# Patient Record
Sex: Male | Born: 1970 | Race: White | Hispanic: No | Smoking: Never smoker
Health system: Southern US, Community
[De-identification: ages and names within clinical notes are randomized; demographics above are authoritative.]

## PROBLEM LIST (undated history)

## (undated) DIAGNOSIS — M109 Gout, unspecified: Secondary | ICD-10-CM

## (undated) HISTORY — PX: CYST REMOVAL PEDIATRIC: SHX6282

---

## 1997-10-22 ENCOUNTER — Emergency Department (HOSPITAL_COMMUNITY): Admission: EM | Admit: 1997-10-22 | Discharge: 1997-10-22 | Payer: Self-pay | Admitting: Internal Medicine

## 1997-10-30 ENCOUNTER — Inpatient Hospital Stay (HOSPITAL_COMMUNITY): Admission: EM | Admit: 1997-10-30 | Discharge: 1997-10-31 | Payer: Self-pay | Admitting: Emergency Medicine

## 2000-06-16 ENCOUNTER — Encounter: Payer: Self-pay | Admitting: Emergency Medicine

## 2000-06-16 ENCOUNTER — Emergency Department (HOSPITAL_COMMUNITY): Admission: EM | Admit: 2000-06-16 | Discharge: 2000-06-16 | Payer: Self-pay | Admitting: Emergency Medicine

## 2000-06-23 ENCOUNTER — Ambulatory Visit (HOSPITAL_BASED_OUTPATIENT_CLINIC_OR_DEPARTMENT_OTHER): Admission: RE | Admit: 2000-06-23 | Discharge: 2000-06-23 | Payer: Self-pay | Admitting: Otolaryngology

## 2005-05-18 ENCOUNTER — Encounter: Payer: Self-pay | Admitting: Emergency Medicine

## 2012-04-28 ENCOUNTER — Emergency Department (HOSPITAL_COMMUNITY)
Admission: EM | Admit: 2012-04-28 | Discharge: 2012-04-28 | Disposition: A | Discharge status: Home / Self Care | Payer: Self-pay | Attending: Emergency Medicine | Admitting: Emergency Medicine

## 2012-04-28 ENCOUNTER — Encounter (HOSPITAL_COMMUNITY): Payer: Self-pay | Admitting: *Deleted

## 2012-04-28 DIAGNOSIS — R5381 Other malaise: Secondary | ICD-10-CM | POA: Insufficient documentation

## 2012-04-28 DIAGNOSIS — R51 Headache: Secondary | ICD-10-CM | POA: Insufficient documentation

## 2012-04-28 DIAGNOSIS — R05 Cough: Secondary | ICD-10-CM | POA: Insufficient documentation

## 2012-04-28 DIAGNOSIS — J329 Chronic sinusitis, unspecified: Secondary | ICD-10-CM | POA: Insufficient documentation

## 2012-04-28 DIAGNOSIS — Z862 Personal history of diseases of the blood and blood-forming organs and certain disorders involving the immune mechanism: Secondary | ICD-10-CM | POA: Insufficient documentation

## 2012-04-28 DIAGNOSIS — R6883 Chills (without fever): Secondary | ICD-10-CM | POA: Insufficient documentation

## 2012-04-28 DIAGNOSIS — R059 Cough, unspecified: Secondary | ICD-10-CM | POA: Insufficient documentation

## 2012-04-28 DIAGNOSIS — Z8639 Personal history of other endocrine, nutritional and metabolic disease: Secondary | ICD-10-CM | POA: Insufficient documentation

## 2012-04-28 DIAGNOSIS — Z9889 Other specified postprocedural states: Secondary | ICD-10-CM | POA: Insufficient documentation

## 2012-04-28 HISTORY — DX: Gout, unspecified: M10.9

## 2012-04-28 MED ORDER — AMOXICILLIN-POT CLAVULANATE 875-125 MG PO TABS: 1.0000 | ORAL_TABLET | Freq: Two times a day (BID) | ORAL | Status: DC

## 2012-04-28 MED ORDER — CETIRIZINE-PSEUDOEPHEDRINE ER 5-120 MG PO TB12: 1.0000 | ORAL_TABLET | Freq: Every day | ORAL | Status: DC

## 2012-04-28 MED ORDER — FLUTICASONE PROPIONATE 50 MCG/ACT NA SUSP: 2.0000 | Freq: Every day | NASAL | Status: DC

## 2012-04-28 NOTE — ED Provider Notes (Signed)
History     CSN: [id number]  Arrival date & time 04/28/12  2230   First MD Initiated Contact with Patient 04/28/12 2316      Chief Complaint  Patient presents with  . Nasal Congestion   HPI  History provided by the patient. Patient is a 42 year old male with history of previous left maxillary sinus surgery and cyst removal who presents with complaints of persistent nasal congestion, bilateral sinus pressure and headache. Symptoms have been present for the past 3 weeks. Patient has been using 600 mg Mucinex for several days without any improvement. He also took 5 days of amoxicillin from a friend 2 weeks ago without any improvement. Symptoms are associated with occasional productive cough. Denies any fever or sweats. Occasional chills. Denies any appetite change. No nausea vomiting or diarrhea. Denies any other aggravating or alleviating factors. Denies any other associated symptoms.    Past Medical History  Diagnosis Date  . Gout     Past Surgical History  Procedure Laterality Date  . Cyst removal pediatric      "cyst removal on nasal sinuses"    History reviewed. No pertinent family history.  History  Substance Use Topics  . Smoking status: Never Smoker   . Smokeless tobacco: Not on file  . Alcohol Use: No      Review of Systems  Constitutional: Positive for chills and fatigue. Negative for fever, diaphoresis and appetite change.  HENT: Positive for congestion, rhinorrhea and sinus pressure. Negative for ear pain and sore throat.   Respiratory: Positive for cough.   Gastrointestinal: Negative for nausea, vomiting and diarrhea.  Neurological: Positive for headaches. Negative for light-headedness.  All other systems reviewed and are negative.    Allergies  Penicillins  Home Medications  No current outpatient prescriptions on file.  BP 134/91  Pulse 85  Temp(Src) 98.5 F (36.9 C) (Oral)  Resp 18  SpO2 96%  Physical Exam  Nursing note and vitals  reviewed. Constitutional: He is oriented to person, place, and time. He appears well-developed and well-nourished. No distress.  HENT:  Head: Normocephalic.  Right Ear: Tympanic membrane normal.  Left Ear: Tympanic membrane normal.  Nose: Mucosal edema and rhinorrhea present. Right sinus exhibits maxillary sinus tenderness. Left sinus exhibits maxillary sinus tenderness.  Mouth/Throat: Uvula is midline, oropharynx is clear and moist and mucous membranes are normal.  Drainage in both nostrils with significant edema. There is irritation around the skin of the nostrils consistent with frequent nose blowing.  Eyes: Conjunctivae and EOM are normal. Pupils are equal, round, and reactive to light.  Neck: Normal range of motion. Neck supple.  No meningeal signs  Cardiovascular: Normal rate and regular rhythm.   No murmur heard. Pulmonary/Chest: Effort normal and breath sounds normal. No respiratory distress. He has no wheezes. He has no rales.  Abdominal: Soft.  Musculoskeletal: Normal range of motion.  Lymphadenopathy:    He has no cervical adenopathy.  Neurological: He is alert and oriented to person, place, and time.  Skin: Skin is warm.  Psychiatric: He has a normal mood and affect. His behavior is normal.    ED Course  Procedures      1. Sinusitis       MDM  11:30 PM patient seen and evaluated. Patient appears well in no acute distress. He does not appear severely ill or toxic.  Patient reports his allergy penicillin in his stomach upset. Does admit to recently using amoxicillin for 5 days from a friend. No adverse reaction.  Angus Seller, PA-C 04/28/12 2346

## 2012-04-28 NOTE — ED Notes (Signed)
Pt c/o cold sx and congestion x 3 weeks.  C/o productive cough, HA.  Has taken Mucinex and other OTC medications.

## 2012-04-29 NOTE — ED Provider Notes (Signed)
Medical screening examination/treatment/procedure(s) were performed by non-physician practitioner and as supervising physician I was immediately available for consultation/collaboration.  Odessie Polzin, MD 04/29/12 0518 

## 2013-08-26 ENCOUNTER — Encounter (HOSPITAL_COMMUNITY): Payer: Self-pay | Admitting: Emergency Medicine

## 2013-08-26 ENCOUNTER — Emergency Department (HOSPITAL_COMMUNITY)
Admission: EM | Admit: 2013-08-26 | Discharge: 2013-08-26 | Disposition: A | Discharge status: Home / Self Care | Payer: Self-pay | Attending: Emergency Medicine | Admitting: Emergency Medicine

## 2013-08-26 ENCOUNTER — Emergency Department (HOSPITAL_COMMUNITY): Payer: Self-pay

## 2013-08-26 DIAGNOSIS — S060X9A Concussion with loss of consciousness of unspecified duration, initial encounter: Secondary | ICD-10-CM | POA: Insufficient documentation

## 2013-08-26 DIAGNOSIS — M795 Residual foreign body in soft tissue: Secondary | ICD-10-CM | POA: Insufficient documentation

## 2013-08-26 DIAGNOSIS — T148XXA Other injury of unspecified body region, initial encounter: Secondary | ICD-10-CM

## 2013-08-26 DIAGNOSIS — R4020 Unspecified coma: Secondary | ICD-10-CM

## 2013-08-26 DIAGNOSIS — S161XXA Strain of muscle, fascia and tendon at neck level, initial encounter: Secondary | ICD-10-CM

## 2013-08-26 DIAGNOSIS — Z23 Encounter for immunization: Secondary | ICD-10-CM | POA: Insufficient documentation

## 2013-08-26 DIAGNOSIS — Z8639 Personal history of other endocrine, nutritional and metabolic disease: Secondary | ICD-10-CM | POA: Insufficient documentation

## 2013-08-26 DIAGNOSIS — Z862 Personal history of diseases of the blood and blood-forming organs and certain disorders involving the immune mechanism: Secondary | ICD-10-CM | POA: Insufficient documentation

## 2013-08-26 DIAGNOSIS — Y9389 Activity, other specified: Secondary | ICD-10-CM | POA: Insufficient documentation

## 2013-08-26 DIAGNOSIS — S139XXA Sprain of joints and ligaments of unspecified parts of neck, initial encounter: Secondary | ICD-10-CM | POA: Insufficient documentation

## 2013-08-26 DIAGNOSIS — IMO0002 Reserved for concepts with insufficient information to code with codable children: Secondary | ICD-10-CM | POA: Insufficient documentation

## 2013-08-26 DIAGNOSIS — Z880 Allergy status to penicillin: Secondary | ICD-10-CM | POA: Insufficient documentation

## 2013-08-26 DIAGNOSIS — Y92410 Unspecified street and highway as the place of occurrence of the external cause: Secondary | ICD-10-CM | POA: Insufficient documentation

## 2013-08-26 MED ORDER — TETANUS-DIPHTH-ACELL PERTUSSIS 5-2.5-18.5 LF-MCG/0.5 IM SUSP
0.5000 mL | Freq: Once | INTRAMUSCULAR | Status: AC
  Administered 2013-08-26: 0.5 mL via INTRAMUSCULAR
  Filled 2013-08-26: qty 0.5

## 2013-08-26 MED ORDER — TRAMADOL HCL 50 MG PO TABS: 50.0000 mg | ORAL_TABLET | Freq: Four times a day (QID) | ORAL | Status: DC | PRN

## 2013-08-26 NOTE — Discharge Instructions (Signed)
Abrasion An abrasion is a cut or scrape of the skin. Abrasions do not extend through all layers of the skin and most heal within 10 days. It is important to care for your abrasion properly to prevent infection. CAUSES  Most abrasions are caused by falling on, or gliding across, the ground or other surface. When your skin rubs on something, the outer and inner layer of skin rubs off, causing an abrasion. DIAGNOSIS  Your caregiver will be able to diagnose an abrasion during a physical exam.  TREATMENT  Your treatment depends on how large and deep the abrasion is. Generally, your abrasion will be cleaned with water and a mild soap to remove any dirt or debris. An antibiotic ointment may be put over the abrasion to prevent an infection. A bandage (dressing) may be wrapped around the abrasion to keep it from getting dirty.  You may need a tetanus shot if:  You cannot remember when you had your last tetanus shot.  You have never had a tetanus shot.  The injury broke your skin. If you get a tetanus shot, your arm may swell, get red, and feel warm to the touch. This is common and not a problem. If you need a tetanus shot and you choose not to have one, there is a rare chance of getting tetanus. Sickness from tetanus can be serious.  HOME CARE INSTRUCTIONS   If a dressing was applied, change it at least once a day or as directed by your caregiver. If the bandage sticks, soak it off with warm water.   Wash the area with water and a mild soap to remove all the ointment 2 times a day. Rinse off the soap and pat the area dry with a clean towel.   Reapply any ointment as directed by your caregiver. This will help prevent infection and keep the bandage from sticking. Use gauze over the wound and under the dressing to help keep the bandage from sticking.   Change your dressing right away if it becomes wet or dirty.   Only take over-the-counter or prescription medicines for pain, discomfort, or fever as  directed by your caregiver.   Follow up with your caregiver within 24-48 hours for a wound check, or as directed. If you were not given a wound-check appointment, look closely at your abrasion for redness, swelling, or pus. These are signs of infection. SEEK IMMEDIATE MEDICAL CARE IF:   You have increasing pain in the wound.   You have redness, swelling, or tenderness around the wound.   You have pus coming from the wound.   You have a fever or persistent symptoms for more than 2-3 days.  You have a fever and your symptoms suddenly get worse.  You have a bad smell coming from the wound or dressing.  MAKE SURE YOU:   Understand these instructions.  Will watch your condition.  Will get help right away if you are not doing well or get worse. Document Released: 10/13/2004 Document Revised: 12/21/2011 Document Reviewed: 12/07/2010 [facility] Patient Information 2015 [place], [place]. This information is not intended to replace advice given to you by your health care provider. Make sure you discuss any questions you have with your health care provider. Your x rays are normal the foreign body was removed for the skin wash the area with soap and water and apply a small amount of antibiotic ointment 1-2 times a day for the next 1-2 days
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
## 2013-08-30 NOTE — ED Provider Notes (Signed)
Medical screening examination/treatment/procedure(s) were performed by non-physician practitioner and as supervising physician I was immediately available for consultation/collaboration.   EKG Interpretation None        Matthew Gentry, MD 08/30/13 1441 

## 2015-03-18 ENCOUNTER — Emergency Department (HOSPITAL_COMMUNITY)
Admission: EM | Admit: 2015-03-18 | Discharge: 2015-03-18 | Disposition: A | Discharge status: Home / Self Care | Payer: Self-pay | Attending: Emergency Medicine | Admitting: Emergency Medicine

## 2015-03-18 ENCOUNTER — Encounter (HOSPITAL_COMMUNITY): Payer: Self-pay

## 2015-03-18 DIAGNOSIS — Z880 Allergy status to penicillin: Secondary | ICD-10-CM | POA: Insufficient documentation

## 2015-03-18 DIAGNOSIS — N390 Urinary tract infection, site not specified: Secondary | ICD-10-CM

## 2015-03-18 DIAGNOSIS — Z8739 Personal history of other diseases of the musculoskeletal system and connective tissue: Secondary | ICD-10-CM | POA: Insufficient documentation

## 2015-03-18 DIAGNOSIS — R339 Retention of urine, unspecified: Secondary | ICD-10-CM

## 2015-03-18 LAB — BASIC METABOLIC PANEL
ANION GAP: 9 (ref 5–15)
BUN: 11 mg/dL (ref 6–20)
CO2: 24 mmol/L (ref 22–32)
Calcium: 8.9 mg/dL (ref 8.9–10.3)
Chloride: 105 mmol/L (ref 101–111)
Creatinine, Ser: 1.39 mg/dL — ABNORMAL HIGH (ref 0.61–1.24)
GFR calc Af Amer: >60 mL/min (ref >60)
GFR, EST NON AFRICAN AMERICAN: 59 mL/min — AB (ref >60)
Glucose, Bld: 122 mg/dL — ABNORMAL HIGH (ref 65–99)
Potassium: 3.6 mmol/L (ref 3.5–5.1)
SODIUM: 138 mmol/L (ref 135–145)

## 2015-03-18 LAB — URINE MICROSCOPIC-ADD ON

## 2015-03-18 LAB — URINALYSIS, ROUTINE W REFLEX MICROSCOPIC
Bilirubin Urine: NEGATIVE
GLUCOSE, UA: NEGATIVE mg/dL
Hgb urine dipstick: NEGATIVE
Ketones, ur: NEGATIVE mg/dL
Nitrite: NEGATIVE
PROTEIN: NEGATIVE mg/dL
Specific Gravity, Urine: 1.017 (ref 1.005–1.030)
pH: 5.5 (ref 5.0–8.0)

## 2015-03-18 LAB — CBC
HEMATOCRIT: 47.8 % (ref 39.0–52.0)
Hemoglobin: 16 g/dL (ref 13.0–17.0)
MCH: 30.1 pg (ref 26.0–34.0)
MCHC: 33.5 g/dL (ref 30.0–36.0)
MCV: 89.8 fL (ref 78.0–100.0)
Platelets: 183 10*3/uL (ref 150–400)
RBC: 5.32 MIL/uL (ref 4.22–5.81)
RDW: 13.4 % (ref 11.5–15.5)
WBC: 6.9 10*3/uL (ref 4.0–10.5)

## 2015-03-18 LAB — I-STAT CG4 LACTIC ACID, ED: Lactic Acid, Venous: 1.15 mmol/L (ref 0.5–2.0)

## 2015-03-18 MED ORDER — ACETAMINOPHEN 325 MG PO TABS
650.0000 mg | ORAL_TABLET | Freq: Once | ORAL | Status: AC | PRN
  Administered 2015-03-18: 650 mg via ORAL
  Filled 2015-03-18: qty 2

## 2015-03-18 MED ORDER — TAMSULOSIN HCL 0.4 MG PO CAPS: 0.4000 mg | ORAL_CAPSULE | Freq: Every day | ORAL | Status: DC

## 2015-03-18 MED ORDER — CEPHALEXIN 500 MG PO CAPS: 500.0000 mg | ORAL_CAPSULE | Freq: Three times a day (TID) | ORAL | Status: DC

## 2015-03-18 NOTE — Discharge Instructions (Signed)
Acute Urinary Retention, Male Acute urinary retention is the temporary inability to urinate. This is a common problem in older men. As men age their prostates become larger and block the flow of urine from the bladder. This is usually a problem that has come on gradually.  HOME CARE INSTRUCTIONS If you are sent home with a Foley catheter and a drainage system, you will need to discuss the best course of action with your health care provider. While the catheter is in, maintain a good intake of fluids. Keep the drainage bag emptied and lower than your catheter. This is so that contaminated urine will not flow back into your bladder, which could lead to a urinary tract infection. There are two main types of drainage bags. One is a large bag that usually is used at night. It has a good capacity that will allow you to sleep through the night without having to empty it. The second type is called a leg bag. It has a smaller capacity, so it needs to be emptied more frequently. However, the main advantage is that it can be attached by a leg strap and can go underneath your clothing, allowing you the freedom to move about or leave your home. Only take over-the-counter or prescription medicines for pain, discomfort, or fever as directed by your health care provider.  SEEK MEDICAL CARE IF:  You develop a low-grade fever.  You experience spasms or leakage of urine with the spasms. SEEK IMMEDIATE MEDICAL CARE IF:   You develop chills or fever.  Your catheter stops draining urine.  Your catheter falls out.  You start to develop increased bleeding that does not respond to rest and increased fluid intake. MAKE SURE YOU:  Understand these instructions.  Will watch your condition.  Will get help right away if you are not doing well or get worse.   This information is not intended to replace advice given to you by your health care provider. Make sure you discuss any questions you have with your health care  provider.   Document Released: 04/11/2000 Document Revised: 05/20/2014 Document Reviewed: 06/14/2012 Elsevier Interactive Patient Education 2016 [employer]  Antibiotic Medicine Antibiotic medicines are used to treat infections caused by bacteria. They work by hurting or killing the germs that are making you sick. HOW WILL MY MEDICINE BE PICKED? There are many kinds of antibiotic medicines. To help your doctor pick one, tell your doctor if:  You have any allergies.  You are pregnant or plan to get pregnant.  You are breastfeeding.  You are taking any medicines. These include over-the-counter medicines, prescription medicines, and herbal remedies.  You have a medical condition or problem. If you have questions about why your medicine was picked, ask. FOR HOW LONG SHOULD I TAKE MY MEDICINE? Take your medicine for as long as your doctor tells you to. Do not stop taking it when you feel better. If you stop taking it too soon:  You may start to feel sick again.  Your infection may get harder to treat.  New problems may develop. WHAT IF I MISS A DOSE? Try not to miss any doses of antibiotic medicine. If you miss a dose:  Take the dose as soon as you can.  If you are taking 2 doses a day, take the next dose in 5 to 6 hours.  If you are taking 3 or more doses a day, take the next dose in 2 to 4 hours. Then go back to the normal schedule.  If you cannot take a missed dose, take the next dose on time. Then take the missed dose after you have taken all the doses as told by your doctor, as if you had one more dose left. DOES THIS MEDICINE AFFECT BIRTH CONTROL? Birth control pills may not work while you are on antibiotic medicines. If you are taking birth control pills, keep taking them as usual. Use a second form of birth control, such as a condom. Keep using the second form of birth control until you are finished with your current 1 month cycle of birth control pills. GET HELP  IF:  You get worse.  You do not feel better a few days after starting the medicine.  You throw up (vomit).  There are white patches in your mouth.  You have new joint pain after starting the medicine.  You have new muscle aches after starting the medicine.  You had a fever before starting the medicine, and it comes back.  You have any symptoms of an allergic reaction, such as an itchy rash. If this happens, stop taking the medicine. GET HELP RIGHT AWAY IF:  Your pee (urine) turns dark or becomes blood-colored.  Your skin turns yellow.  You bruise or bleed easily.  You have very bad watery poop (diarrhea) and cramps in your belly (abdomen).  You have a very bad headache.  You have signs of a very bad allergic reaction, such as:  Trouble breathing.  Wheezing.  Swelling of the lips, tongue, or face.  Fainting.  Blisters on the skin or in the mouth. If you have signs of a very bad allergic reaction, stop taking the antibiotic medicine right away.   This information is not intended to replace advice given to you by your health care provider. Make sure you discuss any questions you have with your health care provider.   Document Released: 10/13/2007 Document Revised: 09/24/2014 Document Reviewed: 05/21/2014 Elsevier Interactive Patient Education [employer]

## 2015-03-18 NOTE — ED Notes (Signed)
Pt c/o dysuria and decreased urinary output x "a few months."  Pain score 6/10.  Pt reports that his PCP diagnosed him w/ an enlarged prostate several months ago.  Sts "he gave me an antibiotic, but I don't think it worked."

## 2015-03-18 NOTE — ED Provider Notes (Signed)
CSN: [id number]     Arrival date & time 03/18/15  1534 History   First MD Initiated Contact with Patient 03/18/15 1710     Chief Complaint  Patient presents with  . Dysuria  . Fever     (Consider location/radiation/quality/duration/timing/severity/associated sxs/prior Treatment) Patient is a 45 y.o. male presenting with dysuria. The history is provided by the patient.  Dysuria This is a new problem. The current episode started more than 1 week ago. The problem occurs constantly. The problem has been gradually worsening. Pertinent negatives include no chest pain, no abdominal pain and no shortness of breath. Nothing aggravates the symptoms. Nothing relieves the symptoms. Treatments tried: had unknown antibiotic remotely for similar symptoms. The treatment provided no relief.    Past Medical History  Diagnosis Date  . Gout    Past Surgical History  Procedure Laterality Date  . Cyst removal pediatric      "cyst removal on nasal sinuses"   History reviewed. No pertinent family history. Social History  Substance Use Topics  . Smoking status: Never Smoker   . Smokeless tobacco: None  . Alcohol Use: No    Review of Systems  Respiratory: Negative for shortness of breath.   Cardiovascular: Negative for chest pain.  Gastrointestinal: Negative for abdominal pain.  Genitourinary: Positive for dysuria.  All other systems reviewed and are negative.     Allergies  Ibuprofen and Penicillins  Home Medications   Prior to Admission medications   Medication Sig Start Date End Date Taking? Authorizing Provider  amoxicillin-clavulanate (AUGMENTIN) 875-125 MG per tablet Take 1 tablet by mouth every 12 (twelve) hours. Patient not taking: Reported on 03/18/2015 04/28/12   Ivonne Andrew, PA-C  cetirizine-pseudoephedrine (ZYRTEC-D) 5-120 MG per tablet Take 1 tablet by mouth daily. Patient not taking: Reported on 03/18/2015 04/28/12   Ivonne Andrew, PA-C  fluticasone [facility]) 50 MCG/ACT nasal spray  Place 2 sprays into the nose daily. Patient not taking: Reported on 03/18/2015 04/28/12   Ivonne Andrew, PA-C  traMADol (ULTRAM) 50 MG tablet Take 1 tablet (50 mg total) by mouth every 6 (six) hours as needed. Patient not taking: Reported on 03/18/2015 08/26/13   Earley Favor, NP   BP 128/89 mmHg  Pulse 90  Temp(Src) 100.6 F (38.1 C) (Oral)  Resp 20  SpO2 95% Physical Exam  Constitutional: He is oriented to person, place, and time. He appears well-developed and well-nourished. No distress.  HENT:  Head: Normocephalic and atraumatic.  Eyes: Conjunctivae are normal.  Neck: Neck supple. No tracheal deviation present.  Cardiovascular: Normal rate, regular rhythm and normal heart sounds.   Pulmonary/Chest: Effort normal and breath sounds normal. No respiratory distress.  Abdominal: Soft. He exhibits no distension. There is no tenderness. There is no rebound and no guarding.  Neurological: He is alert and oriented to person, place, and time.  Skin: Skin is warm and dry.  Psychiatric: He has a normal mood and affect.  Vitals reviewed.   ED Course  Procedures (including critical care time)  Emergency Focused Ultrasound Exam Limited ultrasound of the Bladder.   Performed and interpreted by Dr. Clydene Pugh Indication: evaluation for urinary retention Transverse and Sagittal views of the bladder are obtained and calculations are performed to determine an estimated bladder volume for signs of post-renal obstruction.  Findings: Bladder is full with 300+cc Interpretation: evidence of outlet obstruction Images archived electronically.  CPT Codes: 40981 and [phone]   Labs Review Labs Reviewed  URINALYSIS, ROUTINE W REFLEX MICROSCOPIC (NOT AT [facility]) - Abnormal;  Notable for the following:    Leukocytes, UA MODERATE (*)    All other components within normal limits  BASIC METABOLIC PANEL - Abnormal; Notable for the following:    Glucose, Bld 122 (*)    Creatinine, Ser 1.39 (*)    GFR calc non Af Amer  59 (*)    All other components within normal limits  URINE MICROSCOPIC-ADD ON - Abnormal; Notable for the following:    Squamous Epithelial / LPF 0-5 (*)    Bacteria, UA FEW (*)    All other components within normal limits  URINE CULTURE  CBC  I-STAT CG4 LACTIC ACID, ED    Imaging Review No results found. I have personally reviewed and evaluated these images and lab results as part of my medical decision-making.   EKG Interpretation None      MDM   Final diagnoses:  Urinary retention  Urinary tract infection without hematuria, site unspecified   45 y.o. male presents with increased frequency, dysuria, and difficulty passing urine. Has h/o prostatic enlargement. Pt with abnormal PVR. Will treat empirically for clinically evident UTI and pyuria. Foley catheter placed for presumed mild urinary retention as inciting etiology and Pt started on flomax. OP f/u with urology to discontinue foley and reassess. Plan to follow up with PCP as needed and return precautions discussed for worsening or new concerning symptoms.     Lyndal Pulley, MD 03/19/15 [phone]

## 2015-03-19 LAB — URINE CULTURE

## 2015-03-20 ENCOUNTER — Encounter (HOSPITAL_COMMUNITY): Payer: Self-pay | Admitting: *Deleted

## 2015-03-20 ENCOUNTER — Emergency Department (HOSPITAL_COMMUNITY)
Admission: EM | Admit: 2015-03-20 | Discharge: 2015-03-21 | Disposition: A | Discharge status: Home / Self Care | Payer: Self-pay | Attending: Emergency Medicine | Admitting: Emergency Medicine

## 2015-03-20 DIAGNOSIS — Y658 Other specified misadventures during surgical and medical care: Secondary | ICD-10-CM | POA: Insufficient documentation

## 2015-03-20 DIAGNOSIS — T839XXA Unspecified complication of genitourinary prosthetic device, implant and graft, initial encounter: Secondary | ICD-10-CM

## 2015-03-20 DIAGNOSIS — T83098A Other mechanical complication of other indwelling urethral catheter, initial encounter: Secondary | ICD-10-CM | POA: Insufficient documentation

## 2015-03-20 DIAGNOSIS — Z880 Allergy status to penicillin: Secondary | ICD-10-CM | POA: Insufficient documentation

## 2015-03-20 DIAGNOSIS — N4889 Other specified disorders of penis: Secondary | ICD-10-CM | POA: Insufficient documentation

## 2015-03-20 DIAGNOSIS — R5383 Other fatigue: Secondary | ICD-10-CM | POA: Insufficient documentation

## 2015-03-20 DIAGNOSIS — Z792 Long term (current) use of antibiotics: Secondary | ICD-10-CM | POA: Insufficient documentation

## 2015-03-20 DIAGNOSIS — Z79899 Other long term (current) drug therapy: Secondary | ICD-10-CM | POA: Insufficient documentation

## 2015-03-20 DIAGNOSIS — R319 Hematuria, unspecified: Secondary | ICD-10-CM | POA: Insufficient documentation

## 2015-03-20 DIAGNOSIS — Z8739 Personal history of other diseases of the musculoskeletal system and connective tissue: Secondary | ICD-10-CM | POA: Insufficient documentation

## 2015-03-20 LAB — CBC
HCT: 47.3 % (ref 39.0–52.0)
HEMOGLOBIN: 16.6 g/dL (ref 13.0–17.0)
MCH: 31.3 pg (ref 26.0–34.0)
MCHC: 35.1 g/dL (ref 30.0–36.0)
MCV: 89.2 fL (ref 78.0–100.0)
Platelets: 173 10*3/uL (ref 150–400)
RBC: 5.3 MIL/uL (ref 4.22–5.81)
RDW: 13.2 % (ref 11.5–15.5)
WBC: 6.3 10*3/uL (ref 4.0–10.5)

## 2015-03-20 LAB — BASIC METABOLIC PANEL
ANION GAP: 10 (ref 5–15)
BUN: 15 mg/dL (ref 6–20)
CALCIUM: 8.7 mg/dL — AB (ref 8.9–10.3)
CO2: 24 mmol/L (ref 22–32)
Chloride: 105 mmol/L (ref 101–111)
Creatinine, Ser: 1.43 mg/dL — ABNORMAL HIGH (ref 0.61–1.24)
GFR calc non Af Amer: 57 mL/min — ABNORMAL LOW (ref >60)
GLUCOSE: 99 mg/dL (ref 65–99)
POTASSIUM: 3.9 mmol/L (ref 3.5–5.1)
Sodium: 139 mmol/L (ref 135–145)

## 2015-03-20 NOTE — ED Notes (Addendum)
Pt states was here earlier in the week and had urinary catheter placed for urinary retention. Pt says that he has been unable to sleep because it is uncomfortable, c/o urine and blood at cathter insertion site of the penis. Clear/yellow urine noted in catheter leg bag. After triage the pt says he "feels weak" "do not have any strength, like I want to pass out". Pt has had constant nausea for several days. Has been taking meds as prescribed.

## 2015-03-21 NOTE — ED Provider Notes (Signed)
CSN: [id number]     Arrival date & time 03/20/15  2038 History   By signing my name below, I, Arlan Organ, attest that this documentation has been prepared under the direction and in the presence of Devoria Albe, MD at [phone].  Electronically Signed: Arlan Organ, ED Scribe. 03/21/2015. 1:11 AM.   Chief Complaint  Patient presents with  . Weakness   The history is provided by the patient. No language interpreter was used.    HPI Comments: Edward Navarro is a 45 y.o. male with a PMHx of enlarged prostate who presents to the Emergency Department complaining of constant, ongoing weakness and fatigue x 1 week. Pt recently had a urinary catheter placed earlier this week for urinary retention (march 2). He states he was on the toilet and thinks the catheter pulled out a little because afterwards states he noted bleeding and constant pain to the tip of the penis and states the urine was draining around the tube from his penis and not draining into the catheter. Due to discomfort, pt states he has been unable to sleep. No recent fever, chills, nausea, or vomiting. He was seen by Urology yesterday with plans to remove the catheter on 03/25/15. Pt is not a smoker. He denies any alcohol consumption. Patient states he since he has been to the emergency department he is no longer draining around the catheter and his back is filling up with urine. He also states the bleeding has stopped. He states when he saw the blood he thought he was going to pass out.  PCP: Evelena Peat, MD   [facility]  Past Medical History  Diagnosis Date  . Gout    Past Surgical History  Procedure Laterality Date  . Cyst removal pediatric      "cyst removal on nasal sinuses"   No family history on file. Social History  Substance Use Topics  . Smoking status: Never Smoker   . Smokeless tobacco: None  . Alcohol Use: No    Review of Systems  Constitutional: Positive for fatigue. Negative for fever and chills.   Gastrointestinal: Negative for nausea and vomiting.  Genitourinary: Positive for hematuria and penile pain.  Neurological: Positive for weakness.  All other systems reviewed and are negative.     Allergies  Ibuprofen and Penicillins  Home Medications   Prior to Admission medications   Medication Sig Start Date End Date Taking? Authorizing Provider  cephALEXin (KEFLEX) 500 MG capsule Take 1 capsule (500 mg total) by mouth 3 (three) times daily. 03/18/15  Yes Lyndal Pulley, MD  tamsulosin (FLOMAX) 0.4 MG CAPS capsule Take 1 capsule (0.4 mg total) by mouth daily. 03/18/15  Yes Lyndal Pulley, MD  cetirizine-pseudoephedrine (ZYRTEC-D) 5-120 MG per tablet Take 1 tablet by mouth daily. Patient not taking: Reported on 03/18/2015 04/28/12   Ivonne Andrew, PA-C  fluticasone [facility]) 50 MCG/ACT nasal spray Place 2 sprays into the nose daily. Patient not taking: Reported on 03/18/2015 04/28/12   Ivonne Andrew, PA-C  traMADol (ULTRAM) 50 MG tablet Take 1 tablet (50 mg total) by mouth every 6 (six) hours as needed. Patient not taking: Reported on 03/18/2015 08/26/13   Earley Favor, NP   Triage Vitals: BP 121/80 mmHg  Pulse 82  Temp(Src) 99.8 F (37.7 C) (Oral)  Resp 20  Ht 6' (1.829 m)  Wt 180 lb (81.647 kg)  BMI 24.41 kg/m2  SpO2 96%  Vital signs normal     Physical Exam  Constitutional: He is oriented to person,  place, and time. He appears well-developed and well-nourished.  HENT:  Head: Normocephalic and atraumatic.  Eyes: Conjunctivae are normal.  Neck: Normal range of motion.  Pulmonary/Chest: Effort normal. No respiratory distress. He exhibits no crepitus.  Abdominal: Soft. Normal appearance. There is no tenderness.  Genitourinary:  Small amount of dried blood at opening of urethra  Slightly blood tingled urine in leg bag that is full of urine  Musculoskeletal:  Moves all extremities well.   Neurological: He is alert and oriented to person, place, and time.  Skin: Skin is warm, dry and  intact.  Psychiatric: He has a normal mood and affect. His speech is normal and behavior is normal. His mood appears not anxious.  Nursing note and vitals reviewed.   ED Course  Procedures (including critical care time)  DIAGNOSTIC STUDIES: Oxygen Saturation is 96% on RA, adequate by my interpretation.    COORDINATION OF CARE: 12:59 AM- Will order BMP and CBC. Discussed treatment plan with pt at bedside and pt agreed to plan.    Nurses report the foley is in good position. Pt remains painfree and catheter draining well and he was discharged.   I checked patient's urine culture from 2 days ago and he had only 3000 colonies.   Labs Review Results for orders placed or performed during the hospital encounter of 03/20/15  Basic metabolic panel  Result Value Ref Range   Sodium 139 135 - 145 mmol/L   Potassium 3.9 3.5 - 5.1 mmol/L   Chloride 105 101 - 111 mmol/L   CO2 24 22 - 32 mmol/L   Glucose, Bld 99 65 - 99 mg/dL   BUN 15 6 - 20 mg/dL   Creatinine, Ser 1.611.43 (H) 0.61 - 1.24 mg/dL   Calcium 8.7 (L) 8.9 - 10.3 mg/dL   GFR calc non Af Amer 57 (L) >60 mL/min   GFR calc Af Amer >60 >60 mL/min   Anion gap 10 5 - 15  CBC  Result Value Ref Range   WBC 6.3 4.0 - 10.5 K/uL   RBC 5.30 4.22 - 5.81 MIL/uL   Hemoglobin 16.6 13.0 - 17.0 g/dL   HCT 09.647.3 04.539.0 - 40.952.0 %   MCV 89.2 78.0 - 100.0 fL   MCH 31.3 26.0 - 34.0 pg   MCHC 35.1 30.0 - 36.0 g/dL   RDW 81.113.2 91.411.5 - 78.215.5 %   Platelets 173 150 - 400 K/uL   Laboratory interpretation all normal      MDM   Final diagnoses:  Foley catheter problem, initial encounter [facility](HCC)    Plan discharge  Devoria AlbeIva Ourania Hamler, MD, FACEP   I personally performed the services described in this documentation, which was scribed in my presence. The recorded information has been reviewed and considered.  Devoria AlbeIva Hodges Treiber, MD, Concha PyoFACEP   Dasean Brow, MD 03/21/15 [phone]

## 2015-03-21 NOTE — Discharge Instructions (Signed)
Recheck if the catheter stops draining again, you get abdominal pain, vomiting, fever or seem worse. Otherwise follow up with [facility] as you already have planned.

## 2015-03-21 NOTE — ED Notes (Signed)
Foley placement was checked and patients foley is flowing without any problems

## 2015-03-25 ENCOUNTER — Emergency Department (HOSPITAL_COMMUNITY)
Admission: EM | Admit: 2015-03-25 | Discharge: 2015-03-25 | Disposition: A | Discharge status: Home / Self Care | Payer: Self-pay | Attending: Emergency Medicine | Admitting: Emergency Medicine

## 2015-03-25 ENCOUNTER — Encounter (HOSPITAL_COMMUNITY): Payer: Self-pay | Admitting: Emergency Medicine

## 2015-03-25 DIAGNOSIS — Y658 Other specified misadventures during surgical and medical care: Secondary | ICD-10-CM | POA: Insufficient documentation

## 2015-03-25 DIAGNOSIS — Z880 Allergy status to penicillin: Secondary | ICD-10-CM | POA: Insufficient documentation

## 2015-03-25 DIAGNOSIS — Z792 Long term (current) use of antibiotics: Secondary | ICD-10-CM | POA: Insufficient documentation

## 2015-03-25 DIAGNOSIS — Z466 Encounter for fitting and adjustment of urinary device: Secondary | ICD-10-CM | POA: Insufficient documentation

## 2015-03-25 DIAGNOSIS — Z79899 Other long term (current) drug therapy: Secondary | ICD-10-CM | POA: Insufficient documentation

## 2015-03-25 DIAGNOSIS — Z8739 Personal history of other diseases of the musculoskeletal system and connective tissue: Secondary | ICD-10-CM | POA: Insufficient documentation

## 2015-03-25 NOTE — ED Provider Notes (Signed)
CSN: [id number]     Arrival date & time 03/25/15  2035 History   First MD Initiated Contact with Patient 03/25/15 2148     Chief Complaint  Patient presents with  . urinary catheter problem       The history is provided by the patient. No language interpreter was used.   Edward Navarro is a 45 y.o. male who presents to the Emergency Department complaining of foley catheter problem. He had a Foley catheter placed 5 days ago for urinary retention. He was supposed to go to the urology office today to have it removed but he was in court all day and unable to make the appointment. He called the office and they told him to deflate the balloon and pull it out himself. He deflated the balloon for the catheter but experienced excruciating pain in his penis and stopped taking out the catheter. On ED arrival the catheter was easily removed and the balloon was deflated. His pain is now resolved.  No fevers, vomiting, abdominal pain.   Past Medical History  Diagnosis Date  . Gout    Past Surgical History  Procedure Laterality Date  . Cyst removal pediatric      "cyst removal on nasal sinuses"   History reviewed. No pertinent family history. Social History  Substance Use Topics  . Smoking status: Never Smoker   . Smokeless tobacco: None  . Alcohol Use: No    Review of Systems  All other systems reviewed and are negative.     Allergies  Ibuprofen and Penicillins  Home Medications   Prior to Admission medications   Medication Sig Start Date End Date Taking? Authorizing Provider  cephALEXin (KEFLEX) 500 MG capsule Take 1 capsule (500 mg total) by mouth 3 (three) times daily. 03/18/15   Lyndal Pulley, MD  cetirizine-pseudoephedrine (ZYRTEC-D) 5-120 MG per tablet Take 1 tablet by mouth daily. Patient not taking: Reported on 03/18/2015 04/28/12   Ivonne Andrew, PA-C  fluticasone [facility]) 50 MCG/ACT nasal spray Place 2 sprays into the nose daily. Patient not taking: Reported on 03/18/2015 04/28/12    Ivonne Andrew, PA-C  tamsulosin (FLOMAX) 0.4 MG CAPS capsule Take 1 capsule (0.4 mg total) by mouth daily. 03/18/15   Lyndal Pulley, MD  traMADol (ULTRAM) 50 MG tablet Take 1 tablet (50 mg total) by mouth every 6 (six) hours as needed. Patient not taking: Reported on 03/18/2015 08/26/13   Earley Favor, NP   BP 112/82 mmHg  Pulse 62  Temp(Src) 98.2 F (36.8 C) (Oral)  Resp 18  Ht 6' (1.829 m)  Wt 180 lb (81.647 kg)  BMI 24.41 kg/m2  SpO2 98% Physical Exam  Constitutional: He is oriented to person, place, and time. He appears well-developed and well-nourished.  HENT:  Head: Normocephalic and atraumatic.  Cardiovascular: Normal rate and regular rhythm.   No murmur heard. Pulmonary/Chest: Effort normal and breath sounds normal. No respiratory distress.  Abdominal: Soft. There is no tenderness. There is no rebound and no guarding.  Genitourinary:  Small amount of blood at the urethral meatus  Musculoskeletal: He exhibits no edema or tenderness.  Neurological: He is alert and oriented to person, place, and time.  Skin: Skin is warm and dry.  Psychiatric: He has a normal mood and affect. His behavior is normal.  Nursing note and vitals reviewed.   ED Course  Procedures (including critical care time) Labs Review Labs Reviewed - No data to display  Imaging Review No results found. I have personally reviewed  and evaluated these images and lab results as part of my medical decision-making.   EKG Interpretation None      MDM   Final diagnoses:  Encounter for Foley catheter removal    Patient here for removal of Foley catheter. While the patient was in triage before catheter was easily removed and the balloon was deflated. He has no residual pain. Plan to DC home with urology follow-up. 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  Tilden FossaElizabeth Mikaela Hilgeman, MD 03/25/15 2351

## 2015-03-25 NOTE — Discharge Instructions (Signed)
Acute Urinary Retention, Male °Acute urinary retention is the temporary inability to urinate. °This is a common problem in older men. As men age their prostates become larger and block the flow of urine from the bladder. This is usually a problem that has come on gradually.  °HOME CARE INSTRUCTIONS °If you are sent home with a Foley catheter and a drainage system, you will need to discuss the best course of action with your health care provider. While the catheter is in, maintain a good intake of fluids. Keep the drainage bag emptied and lower than your catheter. This is so that contaminated urine will not flow back into your bladder, which could lead to a urinary tract infection. °There are two main types of drainage bags. One is a large bag that usually is used at night. It has a good capacity that will allow you to sleep through the night without having to empty it. The second type is called a leg bag. It has a smaller capacity, so it needs to be emptied more frequently. However, the main advantage is that it can be attached by a leg strap and can go underneath your clothing, allowing you the freedom to move about or leave your home. °Only take over-the-counter or prescription medicines for pain, discomfort, or fever as directed by your health care provider.  °SEEK MEDICAL CARE IF: °· You develop a low-grade fever. °· You experience spasms or leakage of urine with the spasms. °SEEK IMMEDIATE MEDICAL CARE IF:  °· You develop chills or fever. °· Your catheter stops draining urine. °· Your catheter falls out. °· You start to develop increased bleeding that does not respond to rest and increased fluid intake. °MAKE SURE YOU: °· Understand these instructions. °· Will watch your condition. °· Will get help right away if you are not doing well or get worse. °  °This information is not intended to replace advice given to you by your health care provider. Make sure you discuss any questions you have with your health care  provider. °  °Document Released: 04/11/2000 Document Revised: 05/20/2014 Document Reviewed: 06/14/2012 °Elsevier Interactive Patient Education ©2016 [employer] ° °

## 2015-03-25 NOTE — ED Notes (Signed)
Pt states he had a catheter placed the other day and his urologist told him to remove it today  Pt was given instruction on how to deflate balloon and remove cath  Pt states he removed the water and tried to remove catheter but it got to a certain point and got stuck and became very painful  Pt states he got dizzy like he was going to pass out so he came here   Checked catheter placement in triage  Balloon was completely deflated  Catheter removed without difficulty

## 2015-07-03 ENCOUNTER — Emergency Department (HOSPITAL_COMMUNITY): Payer: Self-pay

## 2015-07-03 ENCOUNTER — Encounter (HOSPITAL_COMMUNITY): Payer: Self-pay | Admitting: Emergency Medicine

## 2015-07-03 ENCOUNTER — Emergency Department (HOSPITAL_COMMUNITY)
Admission: EM | Admit: 2015-07-03 | Discharge: 2015-07-03 | Disposition: A | Discharge status: Home / Self Care | Payer: Self-pay | Attending: Emergency Medicine | Admitting: Emergency Medicine

## 2015-07-03 DIAGNOSIS — R51 Headache: Secondary | ICD-10-CM | POA: Insufficient documentation

## 2015-07-03 DIAGNOSIS — Z791 Long term (current) use of non-steroidal anti-inflammatories (NSAID): Secondary | ICD-10-CM | POA: Insufficient documentation

## 2015-07-03 DIAGNOSIS — R0981 Nasal congestion: Secondary | ICD-10-CM | POA: Insufficient documentation

## 2015-07-03 DIAGNOSIS — J3489 Other specified disorders of nose and nasal sinuses: Secondary | ICD-10-CM | POA: Insufficient documentation

## 2015-07-03 DIAGNOSIS — R05 Cough: Secondary | ICD-10-CM | POA: Insufficient documentation

## 2015-07-03 DIAGNOSIS — Z79899 Other long term (current) drug therapy: Secondary | ICD-10-CM | POA: Insufficient documentation

## 2015-07-03 DIAGNOSIS — H6091 Unspecified otitis externa, right ear: Secondary | ICD-10-CM

## 2015-07-03 LAB — CBC WITH DIFFERENTIAL/PLATELET
BASOS PCT: 0 %
Basophils Absolute: 0 10*3/uL (ref 0.0–0.1)
Eosinophils Absolute: 0.2 10*3/uL (ref 0.0–0.7)
Eosinophils Relative: 2 %
HEMATOCRIT: 45.9 % (ref 39.0–52.0)
HEMOGLOBIN: 15.7 g/dL (ref 13.0–17.0)
LYMPHS ABS: 1.6 10*3/uL (ref 0.7–4.0)
LYMPHS PCT: 15 %
MCH: 30.1 pg (ref 26.0–34.0)
MCHC: 34.2 g/dL (ref 30.0–36.0)
MCV: 87.9 fL (ref 78.0–100.0)
MONO ABS: 0.8 10*3/uL (ref 0.1–1.0)
MONOS PCT: 8 %
NEUTROS ABS: 7.7 10*3/uL (ref 1.7–7.7)
NEUTROS PCT: 75 %
Platelets: 211 10*3/uL (ref 150–400)
RBC: 5.22 MIL/uL (ref 4.22–5.81)
RDW: 13.5 % (ref 11.5–15.5)
WBC: 10.4 10*3/uL (ref 4.0–10.5)

## 2015-07-03 LAB — BASIC METABOLIC PANEL
Anion gap: 6 (ref 5–15)
BUN: 6 mg/dL (ref 6–20)
CALCIUM: 8.9 mg/dL (ref 8.9–10.3)
CHLORIDE: 107 mmol/L (ref 101–111)
CO2: 28 mmol/L (ref 22–32)
Creatinine, Ser: 1.25 mg/dL — ABNORMAL HIGH (ref 0.61–1.24)
GFR calc non Af Amer: >60 mL/min (ref >60)
GLUCOSE: 116 mg/dL — AB (ref 65–99)
POTASSIUM: 3.8 mmol/L (ref 3.5–5.1)
Sodium: 141 mmol/L (ref 135–145)

## 2015-07-03 MED ORDER — HYDROCODONE-ACETAMINOPHEN 5-325 MG PO TABS
1.0000 | ORAL_TABLET | Freq: Once | ORAL | Status: AC
  Administered 2015-07-03: 1 via ORAL
  Filled 2015-07-03: qty 1

## 2015-07-03 MED ORDER — CIPROFLOXACIN HCL 500 MG PO TABS: 500.0000 mg | ORAL_TABLET | Freq: Two times a day (BID) | ORAL | Status: DC

## 2015-07-03 MED ORDER — CIPROFLOXACIN-DEXAMETHASONE 0.3-0.1 % OT SUSP: 4.0000 [drp] | Freq: Two times a day (BID) | OTIC | Status: DC

## 2015-07-03 MED ORDER — FENTANYL CITRATE (PF) 100 MCG/2ML IJ SOLN
50.0000 ug | Freq: Once | INTRAMUSCULAR | Status: AC
  Administered 2015-07-03: 50 ug via INTRAVENOUS
  Filled 2015-07-03: qty 2

## 2015-07-03 MED ORDER — TRAMADOL HCL 50 MG PO TABS: 50.0000 mg | ORAL_TABLET | Freq: Four times a day (QID) | ORAL | Status: DC | PRN

## 2015-07-03 NOTE — ED Provider Notes (Signed)
Medical screening examination/treatment/procedure(s) were conducted as a shared visit with non-physician practitioner(s) and myself.  I personally evaluated the patient during the encounter.   EKG Interpretation None      Results for orders placed or performed during the hospital encounter of 07/03/15  Basic metabolic panel  Result Value Ref Range   Sodium 141 135 - 145 mmol/L   Potassium 3.8 3.5 - 5.1 mmol/L   Chloride 107 101 - 111 mmol/L   CO2 28 22 - 32 mmol/L   Glucose, Bld 116 (H) 65 - 99 mg/dL   BUN 6 6 - 20 mg/dL   Creatinine, Ser 1.61 (H) 0.61 - 1.24 mg/dL   Calcium 8.9 8.9 - 09.6 mg/dL   GFR calc non Af Amer >60 >60 mL/min   GFR calc Af Amer >60 >60 mL/min   Anion gap 6 5 - 15  CBC with Differential  Result Value Ref Range   WBC 10.4 4.0 - 10.5 K/uL   RBC 5.22 4.22 - 5.81 MIL/uL   Hemoglobin 15.7 13.0 - 17.0 g/dL   HCT 04.5 40.9 - 81.1 %   MCV 87.9 78.0 - 100.0 fL   MCH 30.1 26.0 - 34.0 pg   MCHC 34.2 30.0 - 36.0 g/dL   RDW 91.4 78.2 - 95.6 %   Platelets 211 150 - 400 K/uL   Neutrophils Relative % 75 %   Neutro Abs 7.7 1.7 - 7.7 K/uL   Lymphocytes Relative 15 %   Lymphs Abs 1.6 0.7 - 4.0 K/uL   Monocytes Relative 8 %   Monocytes Absolute 0.8 0.1 - 1.0 K/uL   Eosinophils Relative 2 %   Eosinophils Absolute 0.2 0.0 - 0.7 K/uL   Basophils Relative 0 %   Basophils Absolute 0.0 0.0 - 0.1 K/uL   Ct Head Wo Contrast  07/03/2015  CLINICAL DATA:  Two day history of worsening ear region pain with otic discharge on the right. Right-sided headache and right-sided facial pain EXAM: CT HEAD WITHOUT CONTRAST TECHNIQUE: Contiguous axial images were obtained from the base of the skull through the vertex without intravenous contrast. COMPARISON:  August 26, 2013 FINDINGS: The ventricles are normal in size and configuration. There is no intracranial mass, hemorrhage, extra-axial fluid collection, or midline shift. Gray-white compartments appear normal. No acute infarct is evident.  The bony calvarium appears intact. Mastoids on the left are clear. There is opacification of multiple mastoid air cells on the right. There is no air-fluid level on the right. The middle and inner ear structures appear symmetric and normal bilaterally. No obstruction of the external auditory canals is appreciable. There is opacification of the visualized right maxillary antrum. There is mucosal thickening in the left maxillary antrum. There is opacification of multiple ethmoid air cells bilaterally. 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 No intraorbital lesions are evident. IMPRESSION: There is opacification of multiple mastoid air cells on the right without air-fluid level evident. This mastoid opacity on the right was not present on prior study. Multifocal paranasal sinus disease. 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 No intracranial mass, hemorrhage, or focal gray -white compartment lesions/acute appearing infarct. Electronically Signed   By: Bretta Bang III M.D.   On: 07/03/2015 13:38    Patient seen by me. Patient with complaint of right ear pain since Wednesday. Patient has a serous type fluid draining from the right ear. Also has tenderness to palpation to the mastoid area on that side.  Examination of  the right TM raises some suggestion of maybe some bullae at the lower part of TM not able to appreciate perforation but there should be one there.   CT of the head shows chronic or sinus disease also shows evidence of opacification in the right mastoid air cells but no air-fluid level.  We'll have the physician assistant discuss with ear nose and throat with a treatment plan. Would recommend treatment with oral antibiotics as well as Cipro I ear drops. Follow up with ear nose and throat.  Vanetta MuldersScott Pama Roskos, MD 07/03/15 [phone]

## 2015-07-03 NOTE — ED Notes (Signed)
Pt complaint of right ear pain onset Wednesday unrelieved by Advil.

## 2015-07-03 NOTE — ED Provider Notes (Signed)
CSN: [id number]     Arrival date & time 07/03/15  1052 History  By signing my name below, I, Evon Slack, attest that this documentation has been prepared under the direction and in the presence of [employer], PA-C. Electronically Signed: Evon Slack, ED Scribe. 07/03/2015. 12:41 PM.     Chief Complaint  Patient presents with  . Otalgia    The history is provided by the patient. No language interpreter was used.   HPI Comments: Edward Navarro is a 45 y.o. male who presents to the Emergency Department complaining of worsening right ear pain onset 2 days prior. Pt states that he has has associated ear discharge as well. Pt states she has had associated nasal congestion, cough, sore throat, HA and right sided facial pain. Pt states that he  Pt states that he has tried Advil and mucinex with no relief. Denies fever, chills or vision changes.       Past Medical History  Diagnosis Date  . Gout    Past Surgical History  Procedure Laterality Date  . Cyst removal pediatric      "cyst removal on nasal sinuses"   No family history on file. Social History  Substance Use Topics  . Smoking status: Never Smoker   . Smokeless tobacco: None  . Alcohol Use: No    Review of Systems  Constitutional: Negative for fever and chills.  HENT: Positive for congestion, ear discharge, ear pain and rhinorrhea.   Eyes: Negative for visual disturbance.  Respiratory: Positive for cough.   Neurological: Positive for headaches.   A complete 10 system review of systems was obtained and all systems are negative except as noted in the HPI and PMH.     Allergies  Ibuprofen and Penicillins  Home Medications   Prior to Admission medications   Medication Sig Start Date End Date Taking? Authorizing Provider  colchicine 0.6 MG tablet Take 0.6 mg by mouth daily.   Yes Historical Provider, MD  guaiFENesin (MUCINEX) 600 MG 12 hr tablet Take 600 mg by mouth daily as needed for cough.   Yes  Historical Provider, MD  ibuprofen (ADVIL,MOTRIN) 200 MG tablet Take 200 mg by mouth every 6 (six) hours as needed for moderate pain.   Yes Historical Provider, MD  cephALEXin (KEFLEX) 500 MG capsule Take 1 capsule (500 mg total) by mouth 3 (three) times daily. Patient not taking: Reported on 07/03/2015 03/18/15   Lyndal Pulley, MD  cetirizine-pseudoephedrine (ZYRTEC-D) 5-120 MG per tablet Take 1 tablet by mouth daily. Patient not taking: Reported on 03/18/2015 04/28/12   Ivonne Andrew, PA-C  fluticasone [facility]) 50 MCG/ACT nasal spray Place 2 sprays into the nose daily. Patient not taking: Reported on 03/18/2015 04/28/12   Ivonne Andrew, PA-C  tamsulosin (FLOMAX) 0.4 MG CAPS capsule Take 1 capsule (0.4 mg total) by mouth daily. Patient not taking: Reported on 07/03/2015 03/18/15   Lyndal Pulley, MD  traMADol (ULTRAM) 50 MG tablet Take 1 tablet (50 mg total) by mouth every 6 (six) hours as needed. Patient not taking: Reported on 03/18/2015 08/26/13   Earley Favor, NP   BP 145/95 mmHg  Pulse 61  Temp(Src) 98.2 F (36.8 C) (Oral)  Resp 15  SpO2 99%   Physical Exam  Constitutional: He is oriented to person, place, and time. He appears well-developed and well-nourished. No distress.  HENT:  Head: Normocephalic and atraumatic.  Right Ear: There is tenderness.  Clear yellowish discharge coming out of right external ear, tenderness along right externa  canal. Diffuse tenderness in right posterior ear region and diffuse tenderness in right temporal area with some mild erythema.  No tenderness to tragus or auricle. However auricle is slightly protruding.  Eyes: Conjunctivae and EOM are normal.  Neck: Neck supple. No tracheal deviation present.  Cardiovascular: Normal rate.   Pulmonary/Chest: Effort normal. No respiratory distress.  Musculoskeletal: Normal range of motion.  Neurological: He is alert and oriented to person, place, and time.  Cranial nerves II through XII grossly intact.  Skin: Skin is warm and  dry.  Psychiatric: He has a normal mood and affect. His behavior is normal.  Nursing note and vitals reviewed.   ED Course  Procedures (including critical care time) DIAGNOSTIC STUDIES: Oxygen Saturation is 99% on RA, normal by my interpretation.    COORDINATION OF CARE: 12:41 PM-Discussed treatment plan with pt at bedside and pt agreed to plan.    Labs Review Labs Reviewed  CBC WITH DIFFERENTIAL/PLATELET  BASIC METABOLIC PANEL    Imaging Review Ct Head Wo Contrast  07/03/2015  CLINICAL DATA:  Two day history of worsening ear region pain with otic discharge on the right. Right-sided headache and right-sided facial pain EXAM: CT HEAD WITHOUT CONTRAST TECHNIQUE: Contiguous axial images were obtained from the base of the skull through the vertex without intravenous contrast. COMPARISON:  August 26, 2013 FINDINGS: The ventricles are normal in size and configuration. There is no intracranial mass, hemorrhage, extra-axial fluid collection, or midline shift. Gray-white compartments appear normal. No acute infarct is evident. The bony calvarium appears intact. Mastoids on the left are clear. There is opacification of multiple mastoid air cells on the right. There is no air-fluid level on the right. The middle and inner ear structures appear symmetric and normal bilaterally. No obstruction of the external auditory canals is appreciable. There is opacification of the visualized right maxillary antrum. There is mucosal thickening in the left maxillary antrum. There is opacification of multiple ethmoid air cells bilaterally. 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 No intraorbital lesions are evident. IMPRESSION: There is opacification of multiple mastoid air cells on the right without air-fluid level evident. This mastoid opacity on the right was not present on prior study. Multifocal paranasal sinus disease. 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 No  intracranial mass, hemorrhage, or focal gray -white compartment lesions/acute appearing infarct. Electronically Signed   By: Bretta BangWilliam  Woodruff III M.D.   On: 07/03/2015 13:38      EKG Interpretation None      MDM   Patient presents with otalgia and exam Concerning for possible mastoiditis. Discussed with Dr. Deretha EmoryZackowski. We will obtain basic labs, CT for further evaluation of etiology of pain. He is afebrile and hemodynamically stable. Pain managed in emergency department. Not consistent with HSV/Ramsay Hunt. Screening labs are unremarkable. CT scan shows opacifications in right mastoid air cells but no air-fluid levels. Discussed with ENT, Dr.Wolicki. Presentation is likely more consistent with an external otitis. Will treat with oral ciprofloxacin as well as Ciprodex and will follow-up with patient in clinic 2 weeks.  Final diagnoses:  Otitis externa, right      I personally performed the services described in this documentation, which was scribed in my presence. The recorded information has been reviewed and is accurate.      Joycie PeekBenjamin Charnay Nazario, PA-C 07/03/15 1539

## 2015-07-03 NOTE — Discharge Instructions (Signed)
Your symptoms are likely due to an external ear infection. You'll be treated for this with oral antibiotics as well as antibiotic drops. Please take all of your medications as prescribed. Follow-up with ENT, Dr. Lazarus SalinesWolicki, in 2 weeks for reevaluation. Return to ED for new or worsening symptoms as we discussed.  Otitis Externa Otitis externa is a bacterial or fungal infection of the outer ear canal. This is the area from the eardrum to the outside of the ear. Otitis externa is sometimes called "swimmer's ear." CAUSES  Possible causes of infection include:  Swimming in dirty water.  Moisture remaining in the ear after swimming or bathing.  Mild injury (trauma) to the ear.  Objects stuck in the ear (foreign body).  Cuts or scrapes (abrasions) on the outside of the ear. SIGNS AND SYMPTOMS  The first symptom of infection is often itching in the ear canal. Later signs and symptoms may include swelling and redness of the ear canal, ear pain, and yellowish-white fluid (pus) coming from the ear. The ear pain may be worse when pulling on the earlobe. DIAGNOSIS  Your health care provider will perform a physical exam. A sample of fluid may be taken from the ear and examined for bacteria or fungi. TREATMENT  Antibiotic ear drops are often given for 10 to 14 days. Treatment may also include pain medicine or corticosteroids to reduce itching and swelling. HOME CARE INSTRUCTIONS   Apply antibiotic ear drops to the ear canal as prescribed by your health care provider.  Take medicines only as directed by your health care provider.  If you have diabetes, follow any additional treatment instructions from your health care provider.  Keep all follow-up visits as directed by your health care provider. PREVENTION   Keep your ear dry. Use the corner of a towel to absorb water out of the ear canal after swimming or bathing.  Avoid scratching or putting objects inside your ear. This can damage the ear canal or  remove the protective wax that lines the canal. This makes it easier for bacteria and fungi to grow.  Avoid swimming in lakes, polluted water, or poorly chlorinated pools.  You may use ear drops made of rubbing alcohol and vinegar after swimming. Combine equal parts of white vinegar and alcohol in a bottle. Put 3 or 4 drops into each ear after swimming. SEEK MEDICAL CARE IF:   You have a fever.  Your ear is still red, swollen, painful, or draining pus after 3 days.  Your redness, swelling, or pain gets worse.  You have a severe headache.  You have redness, swelling, pain, or tenderness in the area behind your ear. MAKE SURE YOU:   Understand these instructions.  Will watch your condition.  Will get help right away if you are not doing well or get worse.   This information is not intended to replace advice given to you by your health care provider. Make sure you discuss any questions you have with your health care provider.   Document Released: 01/03/2005 Document Revised: 01/24/2014 Document Reviewed: 01/20/2011 Elsevier Interactive Patient Education [employer] Elsevier Inc.

## 2015-07-14 ENCOUNTER — Emergency Department (HOSPITAL_COMMUNITY)
Admission: EM | Admit: 2015-07-14 | Discharge: 2015-07-14 | Disposition: A | Discharge status: Home / Self Care | Payer: Self-pay | Attending: Emergency Medicine | Admitting: Emergency Medicine

## 2015-07-14 ENCOUNTER — Encounter (HOSPITAL_COMMUNITY): Payer: Self-pay | Admitting: Emergency Medicine

## 2015-07-14 DIAGNOSIS — Z79899 Other long term (current) drug therapy: Secondary | ICD-10-CM | POA: Insufficient documentation

## 2015-07-14 DIAGNOSIS — H6691 Otitis media, unspecified, right ear: Secondary | ICD-10-CM | POA: Insufficient documentation

## 2015-07-14 LAB — CBC WITH DIFFERENTIAL/PLATELET
BASOS ABS: 0 10*3/uL (ref 0.0–0.1)
BASOS PCT: 0 %
EOS ABS: 0.3 10*3/uL (ref 0.0–0.7)
Eosinophils Relative: 2 %
HCT: 46.8 % (ref 39.0–52.0)
Hemoglobin: 16.6 g/dL (ref 13.0–17.0)
Lymphocytes Relative: 19 %
Lymphs Abs: 2.3 10*3/uL (ref 0.7–4.0)
MCH: 30.9 pg (ref 26.0–34.0)
MCHC: 35.5 g/dL (ref 30.0–36.0)
MCV: 87 fL (ref 78.0–100.0)
MONO ABS: 0.6 10*3/uL (ref 0.1–1.0)
MONOS PCT: 5 %
NEUTROS ABS: 8.8 10*3/uL — AB (ref 1.7–7.7)
Neutrophils Relative %: 74 %
PLATELETS: 253 10*3/uL (ref 150–400)
RBC: 5.38 MIL/uL (ref 4.22–5.81)
RDW: 13.5 % (ref 11.5–15.5)
WBC: 12 10*3/uL — ABNORMAL HIGH (ref 4.0–10.5)

## 2015-07-14 LAB — I-STAT CHEM 8, ED
BUN: 8 mg/dL (ref 6–20)
CREATININE: 1.2 mg/dL (ref 0.61–1.24)
Calcium, Ion: 1.15 mmol/L (ref 1.13–1.30)
Chloride: 104 mmol/L (ref 101–111)
Glucose, Bld: 88 mg/dL (ref 65–99)
HEMATOCRIT: 50 % (ref 39.0–52.0)
HEMOGLOBIN: 17 g/dL (ref 13.0–17.0)
POTASSIUM: 3.8 mmol/L (ref 3.5–5.1)
SODIUM: 142 mmol/L (ref 135–145)
TCO2: 26 mmol/L (ref 0–100)

## 2015-07-14 LAB — I-STAT CG4 LACTIC ACID, ED: LACTIC ACID, VENOUS: 0.87 mmol/L (ref 0.5–1.9)

## 2015-07-14 MED ORDER — CIPROFLOXACIN-DEXAMETHASONE 0.3-0.1 % OT SUSP: 4.0000 [drp] | Freq: Once | OTIC | Status: DC

## 2015-07-14 MED ORDER — MORPHINE SULFATE (PF) 4 MG/ML IV SOLN
4.0000 mg | Freq: Once | INTRAVENOUS | Status: AC
  Administered 2015-07-14: 4 mg via INTRAVENOUS
  Filled 2015-07-14: qty 1

## 2015-07-14 MED ORDER — ONDANSETRON HCL 4 MG/2ML IJ SOLN
4.0000 mg | Freq: Once | INTRAMUSCULAR | Status: AC
  Administered 2015-07-14: 4 mg via INTRAVENOUS
  Filled 2015-07-14: qty 2

## 2015-07-14 MED ORDER — TRAMADOL HCL 50 MG PO TABS: 50.0000 mg | ORAL_TABLET | Freq: Four times a day (QID) | ORAL | Status: DC | PRN

## 2015-07-14 MED ORDER — OXYCODONE-ACETAMINOPHEN 5-325 MG PO TABS: 1.0000 | ORAL_TABLET | Freq: Once | ORAL | Status: DC

## 2015-07-14 MED ORDER — CIPROFLOXACIN-DEXAMETHASONE 0.3-0.1 % OT SUSP
4.0000 [drp] | Freq: Once | OTIC | Status: AC
  Administered 2015-07-14: 4 [drp] via OTIC
  Filled 2015-07-14 (×2): qty 7.5

## 2015-07-14 NOTE — Progress Notes (Signed)
EDCM spoke to patient at bedside. Patient [place].  [facility], informed patient of services there and walk in times.  EDCM also provided patient with list of pcps who accept self pay patients, list of discount pharmacies and websites needymeds.org and GoodRX.com for medication assistance, phone number to inquire about the orange card, phone number to inquire about Mediciad, phone number to inquire about the Affordable Care Act, financial resources in the community such as local churches, [employer], urban ministries, and dental assistance for uninsured patients.  Patient thankful for resources.  No further EDCM needs at this time.  Patient reports [facility] [place].  Epic updated.

## 2015-07-14 NOTE — Discharge Instructions (Signed)

## 2015-07-14 NOTE — ED Notes (Signed)
Pt states that he was seen and given antibiotics for a R ear infection last week but he was only able to fill the Cipro and not the ear drops so he is still in unbearable pain. States he has been taking tylenol as well. Alert and oriented.

## 2015-07-14 NOTE — ED Provider Notes (Signed)
Patient sign out from Lakelandatyana K. , [place]-C  Patient was seen on 6/16 for right sided ear pain, concern for mastoiditis at that time and had a negative scan. Dr. Lazarus SalinesWolicki was contacted and it was recommended he be started on Cipro PO and Ciprodex. The patient has been taking oral Ciprodex but has not taking his otic drops because he could not afford them.  I spoke with Dr. Annalee GentaShoemaker, he does not recommend repeating CT scan at this time. He says pt needs cipro-dex drops and to be seen in the office tomorrow. He wants him to call the office at 9 am and tell them he was seen in the ED and needs to be seen same day at the ENT office and have his ear suctioned and looked at with special ENT equipment. Does not recommend admission, changing medication, IV medications.  We will give him CiproDex drops and patient has agreed to call office in the morning.  Medications  ciprofloxacin-dexamethasone (CIPRODEX) 0.3-0.1 % otic suspension 4 drop (4 drops Right Ear Given 07/14/15 2128)  morphine 4 MG/ML injection 4 mg (4 mg Intravenous Given 07/14/15 2129)  ondansetron (ZOFRAN) injection 4 mg (4 mg Intravenous Given 07/14/15 2129)  morphine 4 MG/ML injection 4 mg (4 mg Intravenous Given 07/14/15 2248)    I discussed results, diagnoses and plan with Lucky Cowboyaniel L Hornsby. They voice there understanding and questions were answered. We discussed follow-up recommendations and return precautions.   Marlon Peliffany Ishitha Roper, PA-C 07/14/15 [phone]  Gwyneth SproutWhitney Plunkett, MD 07/15/15 [id number]

## 2015-07-14 NOTE — ED Provider Notes (Signed)
CSN: [id number]     Arrival date & time 07/14/15  2049 History  By signing my name below, I, Phillis HaggisGabriella Gaje, attest that this documentation has been prepared under the direction and in the presence of Yvana Samonte, PA-C. Electronically Signed: Phillis HaggisGabriella Gaje, ED Scribe. 07/14/2015. 9:04 PM.   Chief Complaint  Patient presents with  . Otalgia   The history is provided by the patient. No language interpreter was used.  HPI Comments: Edward Navarro is a 45 y.o. male who presents to the Emergency Department complaining of gradually worsening right ear pain onset 11 days ago. Pt was previously seen in the ED for the same on 07/03/15. He states that he was diagnosed with an ear infection and prescribed ear drops and Cipro. Pt was unable to fill the ear drop prescription due to price. He reports that his pain was bearable yesterday but has increasingly gotten worse today. He has been taking the Cipro as directed and Tylenol for pain to no relief. He denies fever, chills, ear drainage, hearing loss, sore throat, nausea, or vomiting. Pt states that he made an appointment with ENT but is not able to be seen until 07/29/15.  Past Medical History  Diagnosis Date  . Gout    Past Surgical History  Procedure Laterality Date  . Cyst removal pediatric      "cyst removal on nasal sinuses"   History reviewed. No pertinent family history. Social History  Substance Use Topics  . Smoking status: Never Smoker   . Smokeless tobacco: None  . Alcohol Use: No    Review of Systems  Constitutional: Negative for fever and chills.  HENT: Positive for ear pain. Negative for ear discharge, hearing loss and sore throat.   Gastrointestinal: Negative for nausea and vomiting.   Allergies  Ibuprofen and Penicillins  Home Medications   Prior to Admission medications   Medication Sig Start Date End Date Taking? Authorizing Provider  cephALEXin (KEFLEX) 500 MG capsule Take 1 capsule (500 mg total) by mouth 3  (three) times daily. Patient not taking: Reported on 07/03/2015 03/18/15   Lyndal Pulleyaniel Knott, MD  cetirizine-pseudoephedrine (ZYRTEC-D) 5-120 MG per tablet Take 1 tablet by mouth daily. Patient not taking: Reported on 03/18/2015 04/28/12   Ivonne AndrewPeter Dammen, PA-C  ciprofloxacin (CIPRO) 500 MG tablet Take 1 tablet (500 mg total) by mouth 2 (two) times daily. 07/03/15   Joycie PeekBenjamin Cartner, PA-C  ciprofloxacin-dexamethasone (CIPRODEX) otic suspension Place 4 drops into the right ear 2 (two) times daily. 07/03/15   Joycie PeekBenjamin Cartner, PA-C  colchicine 0.6 MG tablet Take 0.6 mg by mouth daily.    Historical Provider, MD  fluticasone (FLONASE) 50 MCG/ACT nasal spray Place 2 sprays into the nose daily. Patient not taking: Reported on 03/18/2015 04/28/12   Ivonne AndrewPeter Dammen, PA-C  guaiFENesin (MUCINEX) 600 MG 12 hr tablet Take 600 mg by mouth daily as needed for cough.    Historical Provider, MD  ibuprofen (ADVIL,MOTRIN) 200 MG tablet Take 200 mg by mouth every 6 (six) hours as needed for moderate pain.    Historical Provider, MD  tamsulosin (FLOMAX) 0.4 MG CAPS capsule Take 1 capsule (0.4 mg total) by mouth daily. Patient not taking: Reported on 07/03/2015 03/18/15   Lyndal Pulleyaniel Knott, MD  traMADol (ULTRAM) 50 MG tablet Take 1 tablet (50 mg total) by mouth every 6 (six) hours as needed. 07/03/15   Joycie PeekBenjamin Cartner, PA-C   BP 148/95 mmHg  Pulse 63  Temp(Src) 98.3 F (36.8 C) (Oral)  Resp 17  SpO2 99% Physical Exam  Constitutional: He is oriented to person, place, and time. He appears well-developed and well-nourished.  HENT:  Head: Normocephalic and atraumatic.  Right ear is shifted forward compared to the left on exam. Tenderness to palpation over her outer ear. Pain with movement of the pinna. Ear canal with mild edema, some purulent drainage noted. Unable to visualize TM. There is diffuse tenderness over posterior ear and mastoid. There is some postauricular lymphadenopathy present as well. Tenderness also to the preauricular area.  Poor dentition with widespread dental decay. No trismus. Oropharynx is normal.  Eyes: EOM are normal.  Neck: Normal range of motion. Neck supple.  Cardiovascular: Normal rate.   Pulmonary/Chest: Effort normal.  Musculoskeletal: Normal range of motion.  Neurological: He is alert and oriented to person, place, and time.  Skin: Skin is warm and dry.  Psychiatric: He has a normal mood and affect. His behavior is normal.  Nursing note and vitals reviewed.   ED Course  Procedures (including critical care time) DIAGNOSTIC STUDIES: Oxygen Saturation is 96% on RA, normal by my interpretation.    COORDINATION OF CARE: 9:04 PM-Discussed treatment plan which includes Ciprodex and percocet in the ED with pt at bedside and pt agreed to plan.    Labs Review Labs Reviewed - No data to display  Imaging Review No results found. I have personally reviewed and evaluated these images and lab results as part of my medical decision-making.   EKG Interpretation None      MDM   Final diagnoses:  Right otitis media, recurrence not specified, unspecified chronicity, unspecified otitis media type   9:17 PM Patient seen and examined. Patient's exam is most consistent with mastoiditis. CT scan from 11 days ago showed opacification of mastoid air cells, however no air-fluid level, case was discussed with Dr. Loreli DollarWaoliki at that time, mastoiditis was suspected, however treated as otitis externa after the consult. He was started on oral Cipro which she is taking, he never filled Ciprodex drops. Discussed with Dr. Clarice PolePfeifer who has seen patient, will get blood counts and get ENT consult.  Signed out to PA greene at shift change pending ENT consult.   Filed Vitals:   07/14/15 2058 07/14/15 2140 07/14/15 2309  BP: 148/95  138/85  Pulse: 63  60  Temp: 98.3 F (36.8 C) 98.7 F (37.1 C)   TempSrc: Oral Rectal   Resp: 17  16  SpO2: 99%  97%     Jaynie Crumbleatyana Yama Nielson, PA-C 07/15/15 2105  Arby BarretteMarcy Pfeiffer,  MD 07/18/15 1128

## 2017-01-17 ENCOUNTER — Emergency Department (HOSPITAL_COMMUNITY): Payer: Self-pay

## 2017-01-17 ENCOUNTER — Emergency Department (HOSPITAL_COMMUNITY)
Admission: EM | Admit: 2017-01-17 | Discharge: 2017-01-18 | Disposition: A | Discharge status: Home / Self Care | Payer: Self-pay | Attending: Emergency Medicine | Admitting: Emergency Medicine

## 2017-01-17 ENCOUNTER — Encounter (HOSPITAL_COMMUNITY): Payer: Self-pay | Admitting: Emergency Medicine

## 2017-01-17 DIAGNOSIS — M25461 Effusion, right knee: Secondary | ICD-10-CM | POA: Insufficient documentation

## 2017-01-17 DIAGNOSIS — M109 Gout, unspecified: Secondary | ICD-10-CM | POA: Insufficient documentation

## 2017-01-17 LAB — SYNOVIAL CELL COUNT + DIFF, W/ CRYSTALS
LYMPHOCYTES-SYNOVIAL FLD: 8 % (ref 0–20)
MONOCYTE-MACROPHAGE-SYNOVIAL FLUID: 10 % — AB (ref 50–90)
NEUTROPHIL, SYNOVIAL: 82 % — AB (ref 0–25)
WBC, Synovial: UNDETERMINED /mm3 (ref 0–200)

## 2017-01-17 LAB — CBG MONITORING, ED: Glucose-Capillary: 76 mg/dL (ref 65–99)

## 2017-01-17 MED ORDER — COLCHICINE 0.6 MG PO TABS
1.2000 mg | ORAL_TABLET | Freq: Once | ORAL | Status: AC
  Administered 2017-01-17: 1.2 mg via ORAL
  Filled 2017-01-17: qty 2

## 2017-01-17 MED ORDER — COLCHICINE 0.6 MG PO TABS: 0.6000 mg | ORAL_TABLET | Freq: Two times a day (BID) | ORAL | 0 refills | Status: AC

## 2017-01-17 MED ORDER — OXYCODONE-ACETAMINOPHEN 5-325 MG PO TABS
1.0000 | ORAL_TABLET | Freq: Once | ORAL | Status: AC
  Administered 2017-01-17: 1 via ORAL
  Filled 2017-01-17: qty 1

## 2017-01-17 MED ORDER — COLCHICINE 0.6 MG PO TABS
0.6000 mg | ORAL_TABLET | Freq: Once | ORAL | Status: AC
  Administered 2017-01-17: 0.6 mg via ORAL
  Filled 2017-01-17: qty 1

## 2017-01-17 MED ORDER — LIDOCAINE HCL 1 % IJ SOLN
INTRAMUSCULAR | Status: AC
  Administered 2017-01-17: 19:00:00
  Filled 2017-01-17: qty 20

## 2017-01-17 MED ORDER — OXYCODONE-ACETAMINOPHEN 5-325 MG PO TABS
2.0000 | ORAL_TABLET | Freq: Once | ORAL | Status: AC
  Administered 2017-01-17: 2 via ORAL
  Filled 2017-01-17: qty 2

## 2017-01-17 MED ORDER — OXYCODONE-ACETAMINOPHEN 5-325 MG PO TABS: 1.0000 | ORAL_TABLET | ORAL | 0 refills | Status: AC | PRN

## 2017-01-17 MED ORDER — COLCHICINE 0.6 MG PO TABS: 0.6000 mg | ORAL_TABLET | Freq: Once | ORAL | Status: DC
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
## 2017-01-20 LAB — GLUCOSE, BODY FLUID OTHER: Glucose, Body Fluid Other: 95 mg/dL

## 2017-01-21 LAB — BODY FLUID CULTURE: Culture: NO GROWTH

## 2017-08-22 IMAGING — CT CT HEAD W/O CM
3 of 4 series · 14 of 47 positions shown, 16 images · non-contrast
Comparison: August 26, 2013

CLINICAL DATA: Two day history of worsening ear region pain with
otic discharge on the right. Right-sided headache and right-sided
facial pain

EXAM:
CT HEAD WITHOUT CONTRAST
TECHNIQUE: Contiguous axial images were obtained from the base of the skull
through the vertex without intravenous contrast.

[Series 2: head w/o · axial · non-contrast · 0.45mm/px · z∈[-132,-12]mm · 8 of 30 slices shown, 10 images]
[im 3/30  brain]
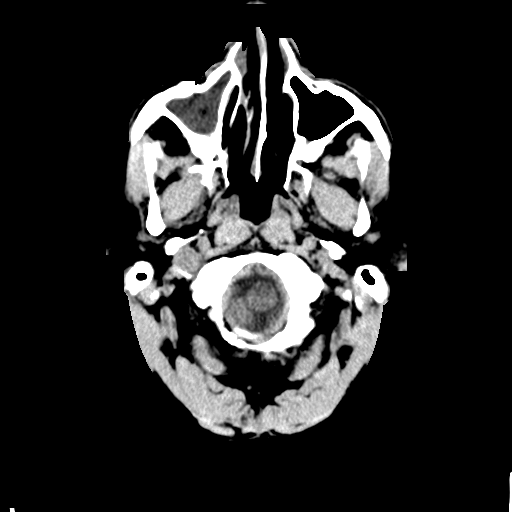
[im 3/30  bone]
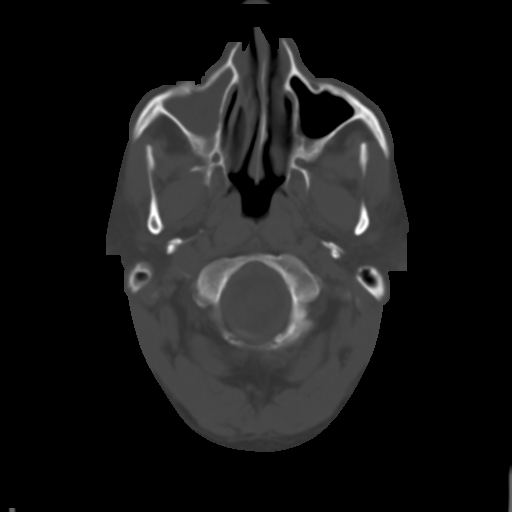
[im 7/30  brain]
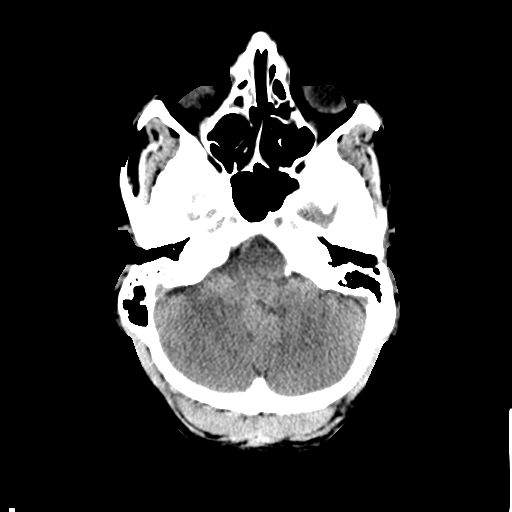
[im 11/30  brain]
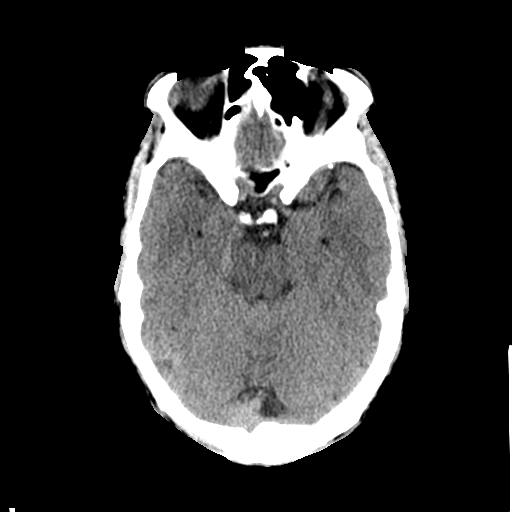
[im 13/30  brain]
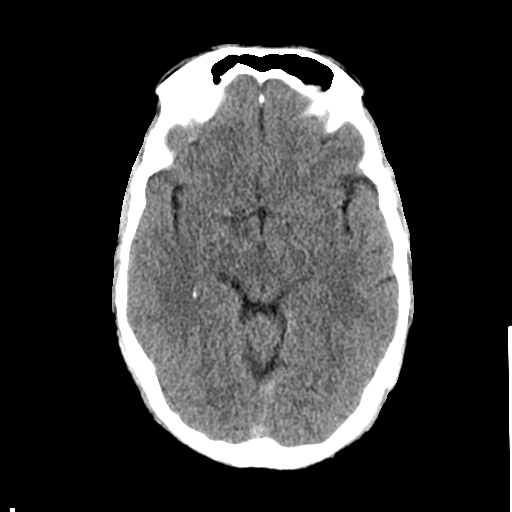
[im 17/30  brain]
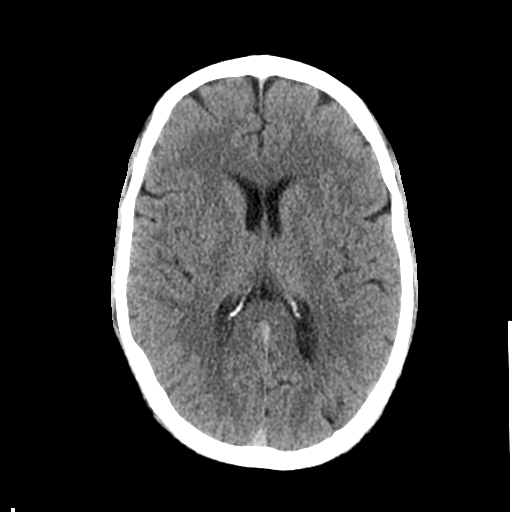
[im 17/30  bone]
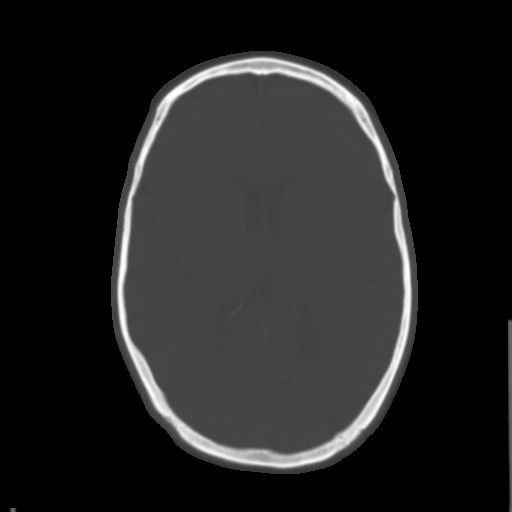
[im 19/30  brain]
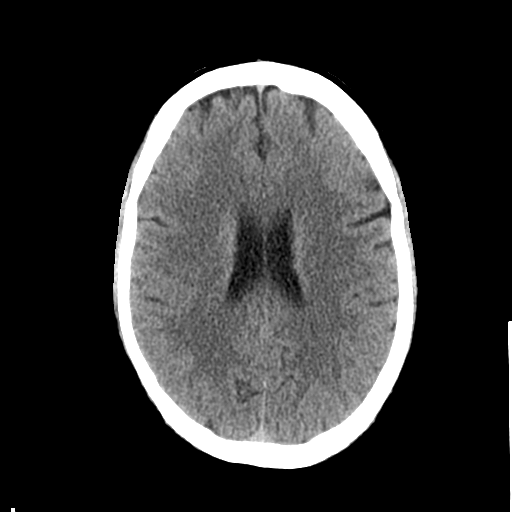
[im 23/30  brain]
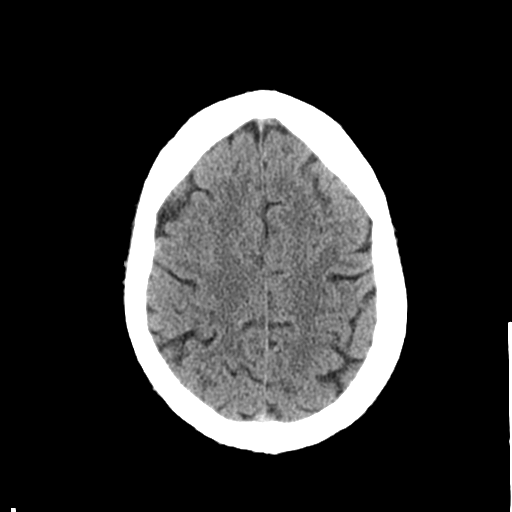
[im 27/30  brain]
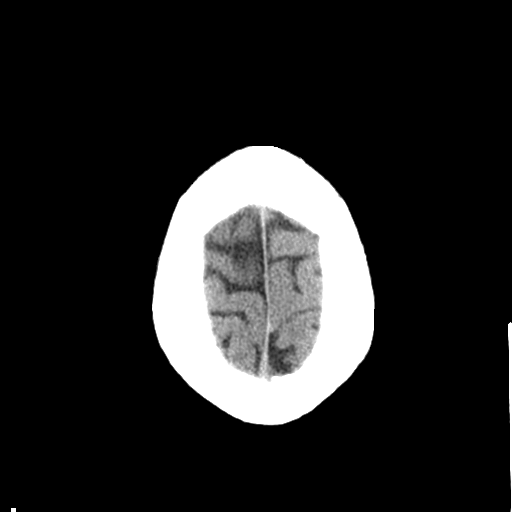

[Series 5: coronal · coronal · 0.29mm/px · 3 of 72 slices shown]
[im 24/72  brain]
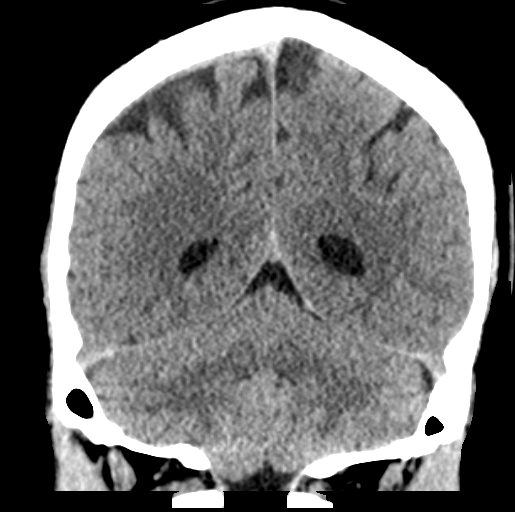
[im 32/72  brain]
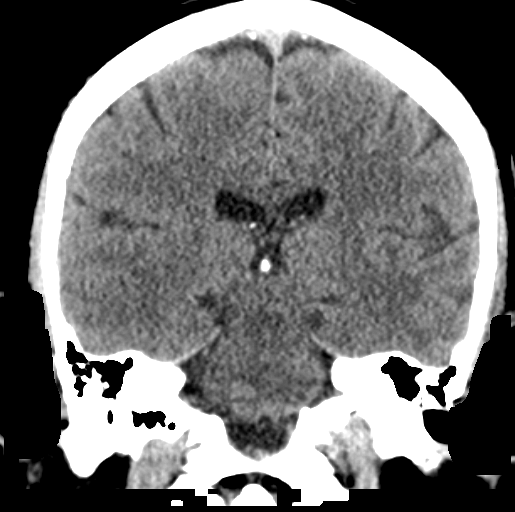
[im 40/72  brain]
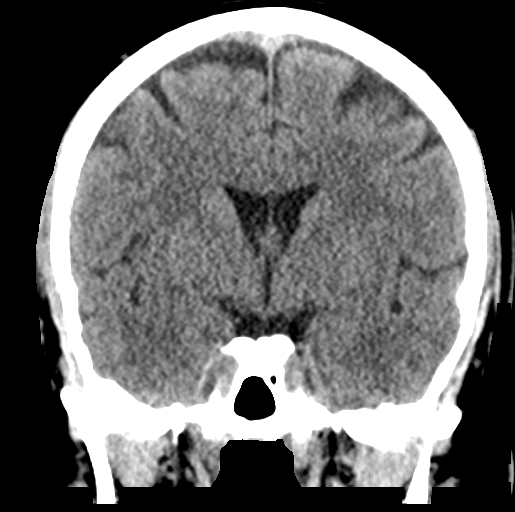

[Series 6: sagittal · sagittal · 0.28mm/px · 3 of 59 slices shown]
[im 20/59  brain]
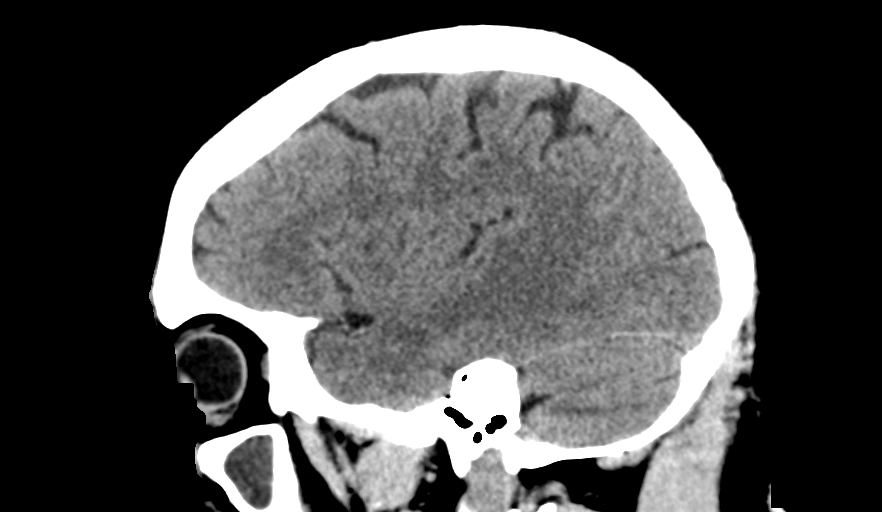
[im 30/59  brain]
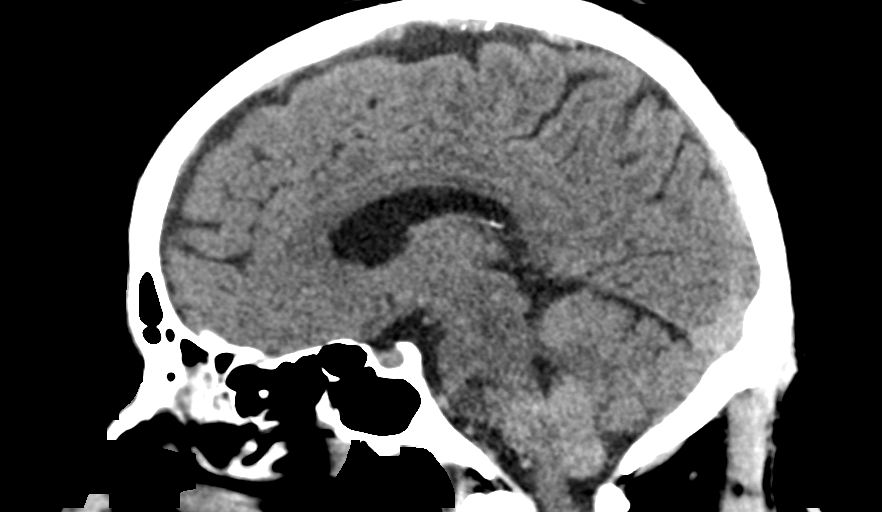
[im 39/59  brain]
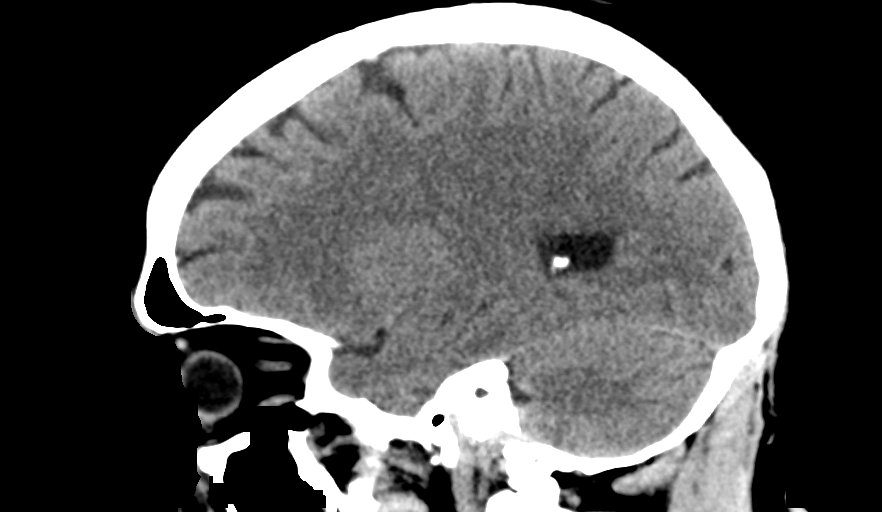

[14 of 47 positions shown; findings below may reference images not displayed]

FINDINGS: The ventricles are normal in size and configuration. There is no
intracranial mass, hemorrhage, extra-axial fluid collection, or
midline shift. Gray-white compartments appear normal. No acute
infarct is evident.

The bony calvarium appears intact. Mastoids on the left are clear.
There is opacification of multiple mastoid air cells on the right.
There is no air-fluid level on the right. The middle and inner ear
structures appear symmetric and normal bilaterally. No obstruction
of the external auditory canals is appreciable.

There is opacification of the visualized right maxillary antrum.
There is mucosal thickening in the left maxillary antrum. There is
opacification of multiple ethmoid air cells bilaterally. There is a
chronic defect in the right lamina papyracea, stable.

No intraorbital lesions are evident.
IMPRESSION: There is opacification of multiple mastoid air cells on the right
without air-fluid level evident. This mastoid opacity on the right
was not present on prior study.

Multifocal paranasal sinus disease.

Chronic defect in the right lamina upper appreciate, stable.

No intracranial mass, hemorrhage, or focal gray -white compartment
lesions/acute appearing infarct.

## 2017-09-02 ENCOUNTER — Other Ambulatory Visit: Payer: Self-pay

## 2017-09-02 ENCOUNTER — Emergency Department (HOSPITAL_COMMUNITY)
Admission: EM | Admit: 2017-09-02 | Discharge: 2017-09-02 | Disposition: A | Discharge status: Home / Self Care | Payer: Medicaid Other | Attending: Emergency Medicine | Admitting: Emergency Medicine

## 2017-09-02 DIAGNOSIS — R42 Dizziness and giddiness: Secondary | ICD-10-CM | POA: Insufficient documentation

## 2017-09-02 DIAGNOSIS — Z79899 Other long term (current) drug therapy: Secondary | ICD-10-CM | POA: Diagnosis not present

## 2017-09-02 LAB — URINALYSIS, ROUTINE W REFLEX MICROSCOPIC
Bilirubin Urine: NEGATIVE
GLUCOSE, UA: NEGATIVE mg/dL
Hgb urine dipstick: NEGATIVE
Ketones, ur: NEGATIVE mg/dL
LEUKOCYTES UA: NEGATIVE
Nitrite: NEGATIVE
PH: 6 (ref 5.0–8.0)
Protein, ur: NEGATIVE mg/dL
Specific Gravity, Urine: 1.015 (ref 1.005–1.030)

## 2017-09-02 LAB — BASIC METABOLIC PANEL
Anion gap: 9 (ref 5–15)
BUN: 11 mg/dL (ref 6–20)
CHLORIDE: 106 mmol/L (ref 98–111)
CO2: 25 mmol/L (ref 22–32)
Calcium: 8.8 mg/dL — ABNORMAL LOW (ref 8.9–10.3)
Creatinine, Ser: 1.32 mg/dL — ABNORMAL HIGH (ref 0.61–1.24)
GFR calc non Af Amer: >60 mL/min (ref >60)
Glucose, Bld: 121 mg/dL — ABNORMAL HIGH (ref 70–99)
POTASSIUM: 3.5 mmol/L (ref 3.5–5.1)
Sodium: 140 mmol/L (ref 135–145)

## 2017-09-02 LAB — CBC
HEMATOCRIT: 43.6 % (ref 39.0–52.0)
Hemoglobin: 15.2 g/dL (ref 13.0–17.0)
MCH: 31.3 pg (ref 26.0–34.0)
MCHC: 34.9 g/dL (ref 30.0–36.0)
MCV: 89.7 fL (ref 78.0–100.0)
Platelets: 232 10*3/uL (ref 150–400)
RBC: 4.86 MIL/uL (ref 4.22–5.81)
RDW: 13.1 % (ref 11.5–15.5)
WBC: 7.6 10*3/uL (ref 4.0–10.5)

## 2017-09-02 LAB — CBG MONITORING, ED: Glucose-Capillary: 108 mg/dL — ABNORMAL HIGH (ref 70–99)

## 2017-09-02 MED ORDER — SODIUM CHLORIDE 0.9 % IV BOLUS
1000.0000 mL | Freq: Once | INTRAVENOUS | Status: AC
  Administered 2017-09-02: 1000 mL via INTRAVENOUS

## 2017-09-02 NOTE — ED Notes (Signed)
NO RX GIVEN 

## 2017-09-02 NOTE — ED Triage Notes (Addendum)
Pt to ED via [facility] with c/o of dizziness. Pt was cleaning the bathroom today and all of a sudden felt dizzy around 1400. Pt sat down as he has had this feeling before and usually goes away but today this did not help. Pt has felt dizzy since the spell at 1400 and also states that he felt weak this morning and almost vomited. Pt states no vertigo in his hx. Pt has no history of HTN and is hypertensive on arrival. Pt is A&O x4 and has hx of gout.

## 2017-09-02 NOTE — ED Notes (Signed)
ED Provider at bedside. EDP [place]  

## 2017-09-02 NOTE — ED Provider Notes (Signed)
[facility]-EMERGENCY DEPT Provider Note   CSN: [id number] Arrival date & time: 09/02/17  1513     History   Chief Complaint Chief Complaint  Patient presents with  . Dizziness    HPI Edward Navarro is a 47 y.o. male.  HPI Patient presents with concern of lightheadedness. He was in his usual state of health until a few moments prior to calling EMS. Patient recalls bending over in the laundry room, and upon standing upright felt dizzy, lightheaded. He has had prior similar episodes, though they typically resolved without intervention. However, patient was persistently unsteady, with lightheadedness and called for assistance. No focal pain, though he does have a mild headache. No vision changes, no confusion, no vomiting, no incontinence, no extremity weakness. No medication or interventions taken for relief, though he notes that he feels somewhat better already. He denies history of substantial illness, though he did have an episode of peptic ulcer disease in the distant past, and has ongoing gout, and his distal lower extremities.  Past Medical History:  Diagnosis Date  . Gout     There are no active problems to display for this patient.   Past Surgical History:  Procedure Laterality Date  . CYST REMOVAL PEDIATRIC     "cyst removal on nasal sinuses"        Home Medications    Prior to Admission medications   Medication Sig Start Date End Date Taking? Authorizing Provider  acetaminophen (TYLENOL) 500 MG tablet Take 1,000 mg by mouth every 6 (six) hours as needed for mild pain, moderate pain or headache.   Yes [provider]  allopurinol (ZYLOPRIM) 100 MG tablet Take 100 mg by mouth daily. 12/20/16  Yes [provider]  colchicine 0.6 MG tablet Take 1 tablet (0.6 mg total) by mouth 2 (two) times daily. 01/17/17  Yes Arby BarrettePfeiffer, Marcy, MD  oxyCODONE-acetaminophen (PERCOCET) 5-325 MG tablet Take 1-2 tablets by mouth every 4 (four)  hours as needed. Patient not taking: Reported on 09/02/2017 01/17/17   Arby BarrettePfeiffer, Marcy, MD    Family History No family history on file.  Social History Social History   Tobacco Use  . Smoking status: Never Smoker  Substance Use Topics  . Alcohol use: No  . Drug use: No     Allergies   Ibuprofen and Penicillins   Review of Systems Review of Systems  Constitutional:       Per HPI, otherwise negative  HENT:       Per HPI, otherwise negative  Respiratory:       Per HPI, otherwise negative  Cardiovascular:       Per HPI, otherwise negative  Gastrointestinal: Negative for vomiting.  Endocrine:       Negative aside from HPI  Genitourinary:       Neg aside from HPI   Musculoskeletal:       Per HPI, otherwise negative  Skin: Negative.   Neurological: Positive for dizziness, light-headedness and headaches. Negative for syncope.     Physical Exam Updated Vital Signs BP (!) 141/90   Pulse 73   Temp 97.7 F (36.5 C) (Oral)   Resp 16   Ht 6' (1.829 m)   Wt 81.6 kg   SpO2 96%   BMI 24.41 kg/m   Physical Exam  Constitutional: He is oriented to person, place, and time. He appears well-developed. No distress.  HENT:  Head: Normocephalic and atraumatic.  Eyes: Conjunctivae and EOM are normal.  Cardiovascular: Normal rate and  regular rhythm.  Pulmonary/Chest: Effort normal. No stridor. No respiratory distress.  Abdominal: He exhibits no distension.  Musculoskeletal: He exhibits no edema.  Neurological: He is alert and oriented to person, place, and time. He displays no atrophy and no tremor. He exhibits normal muscle tone. He displays no seizure activity. Coordination normal.  Skin: Skin is warm and dry.  Psychiatric: He has a normal mood and affect. Cognition and memory are not impaired.  Nursing note and vitals reviewed.    ED Treatments / Results  Labs (all labs ordered are listed, but only abnormal results are displayed) Labs Reviewed  BASIC METABOLIC PANEL -  Abnormal; Notable for the following components:      Result Value   Glucose, Bld 121 (*)    Creatinine, Ser 1.32 (*)    Calcium 8.8 (*)    All other components within normal limits  CBG MONITORING, ED - Abnormal; Notable for the following components:   Glucose-Capillary 108 (*)    All other components within normal limits  CBC  URINALYSIS, ROUTINE W REFLEX MICROSCOPIC    EMS rhythm strip with sinus rhythm, rate 77, nonspecific T wave change, intraventricular conduction delay, otherwise unremarkable    Radiology No results found.  Procedures Procedures (including critical care time)  Medications Ordered in ED Medications  sodium chloride 0.9 % bolus 1,000 mL (1,000 mLs Intravenous New Bag/Given 09/02/17 1741)     Initial Impression / Assessment and Plan / ED Course  I have reviewed the triage vital signs and the nursing notes.  Pertinent labs & imaging results that were available during my care of the patient were reviewed by me and considered in my medical decision making (see chart for details).     6:37 PM On repeat exam the patient is awake and alert watching television, in no distress. We reviewed all findings including generally reassuring labs, aside from persistent mild creatinine elevation. No evidence for ACS, other acute findings. Patient has received fluid resuscitation, continues to improve.  On some suspicion for mild dehydration contributing to his episode of near syncope, no evidence for sustained arrhythmia, no other acute findings. With his improvement here, after fluid resuscitation, the patient was discharged with instructions for close outpatient follow-up.  Final Clinical Impressions(s) / ED Diagnoses  Lightheadedness   Gerhard MunchLockwood, Alejandro Gamel, MD 09/02/17 [phone]

## 2017-09-02 NOTE — Discharge Instructions (Addendum)
As discussed, your evaluation today has been largely reassuring.  But, it is important that you monitor your condition carefully, and do not hesitate to return to the ED if you develop new, or concerning changes in your condition. ? ?Otherwise, please follow-up with your physician for appropriate ongoing care. ? ?

## 2017-09-02 NOTE — ED Notes (Signed)
CBG 108. 

## 2017-09-02 NOTE — ED Notes (Signed)
Bed: WA10 Expected date:  Expected time:  Means of arrival:  Comments: EMS dizziness 

## 2017-09-27 ENCOUNTER — Encounter (HOSPITAL_COMMUNITY): Payer: Self-pay

## 2017-09-27 ENCOUNTER — Emergency Department (HOSPITAL_COMMUNITY): Payer: Medicaid Other

## 2017-09-27 ENCOUNTER — Emergency Department (HOSPITAL_COMMUNITY)
Admission: EM | Admit: 2017-09-27 | Discharge: 2017-09-27 | Disposition: A | Discharge status: Home / Self Care | Payer: Medicaid Other | Attending: Emergency Medicine | Admitting: Emergency Medicine

## 2017-09-27 ENCOUNTER — Other Ambulatory Visit: Payer: Self-pay

## 2017-09-27 DIAGNOSIS — Z79899 Other long term (current) drug therapy: Secondary | ICD-10-CM | POA: Insufficient documentation

## 2017-09-27 DIAGNOSIS — M25522 Pain in left elbow: Secondary | ICD-10-CM | POA: Diagnosis present

## 2017-09-27 DIAGNOSIS — M109 Gout, unspecified: Secondary | ICD-10-CM | POA: Diagnosis not present

## 2017-09-27 MED ORDER — PREDNISONE 20 MG PO TABS: 40.0000 mg | ORAL_TABLET | Freq: Every day | ORAL | 0 refills | Status: DC

## 2017-09-27 MED ORDER — OXYCODONE-ACETAMINOPHEN 5-325 MG PO TABS: 1.0000 | ORAL_TABLET | ORAL | 0 refills | Status: AC | PRN

## 2017-09-27 MED ORDER — IBUPROFEN 400 MG PO TABS: 400.0000 mg | ORAL_TABLET | Freq: Four times a day (QID) | ORAL | 0 refills | Status: AC | PRN

## 2017-09-27 MED ORDER — COLCHICINE 0.6 MG PO TABS: 0.6000 mg | ORAL_TABLET | Freq: Two times a day (BID) | ORAL | 0 refills | Status: AC | PRN

## 2017-09-27 NOTE — ED Triage Notes (Addendum)
Pt states that he has been having left arm pain. Pt states that he has hx of gout and has been taking meds, but has not had any relief. Pt states that the pain has gotten worse. Pt also states he has been sleeping on an air mattress and may have slept on it wrong. Pt states that it is "hard to move my fingers" and cannot lift arm

## 2017-09-27 NOTE — Discharge Instructions (Addendum)
Taking a low dose of ibuprofen for a few days should not give you significant issues with regards to causing a stomach ulcer. I'll leave using them or not to your discretion. I think you'd get better pain relief if you could take it with your other medications.

## 2017-09-30 NOTE — ED Provider Notes (Signed)
[facility]-EMERGENCY DEPT Provider Note   CSN: [id number] Arrival date & time: 09/27/17  1046     History   Chief Complaint Chief Complaint  Patient presents with  . Arm Pain    HPI Edward Navarro is a 47 y.o. male.  HPI  46yM with L elbow pain. Gradual onset several days ago and worsening. Swelling. Denies trauma but questions wether he may have "slept on it wrong." No fever or chills. Hx of gout. Tylenol not helping.    Past Medical History:  Diagnosis Date  . Gout     There are no active problems to display for this patient.   Past Surgical History:  Procedure Laterality Date  . CYST REMOVAL PEDIATRIC     "cyst removal on nasal sinuses"        Home Medications    Prior to Admission medications   Medication Sig Start Date End Date Taking? Authorizing Provider  acetaminophen (TYLENOL) 500 MG tablet Take 1,000 mg by mouth every 6 (six) hours as needed for mild pain, moderate pain or headache.    [provider]  allopurinol (ZYLOPRIM) 100 MG tablet Take 100 mg by mouth daily. 12/20/16   [provider]  colchicine 0.6 MG tablet Take 1 tablet (0.6 mg total) by mouth 2 (two) times daily. 01/17/17   Arby BarrettePfeiffer, Marcy, MD  colchicine 0.6 MG tablet Take 1 tablet (0.6 mg total) by mouth 2 (two) times daily as needed (gout). 09/27/17   Raeford RazorKohut, Surena Welge, MD  ibuprofen (ADVIL,MOTRIN) 400 MG tablet Take 1 tablet (400 mg total) by mouth every 6 (six) hours as needed. 09/27/17   Raeford RazorKohut, Mickie Kozikowski, MD  oxyCODONE-acetaminophen (PERCOCET) 5-325 MG tablet Take 1-2 tablets by mouth every 4 (four) hours as needed. Patient not taking: Reported on 09/02/2017 01/17/17   Arby BarrettePfeiffer, Marcy, MD  oxyCODONE-acetaminophen (PERCOCET/ROXICET) 5-325 MG tablet Take 1 tablet by mouth every 4 (four) hours as needed for severe pain. 09/27/17   Raeford RazorKohut, Brandonlee Navis, MD  predniSONE (DELTASONE) 20 MG tablet Take 2 tablets (40 mg total) by mouth daily. 09/27/17   Raeford RazorKohut, Sadiel Mota, MD     Family History No family history on file.  Social History Social History   Tobacco Use  . Smoking status: Never Smoker  Substance Use Topics  . Alcohol use: No  . Drug use: No     Allergies   Ibuprofen and Penicillins   Review of Systems Review of Systems  All systems reviewed and negative, other than as noted in HPI.  Physical Exam Updated Vital Signs BP 127/84 (BP Location: Left Arm)   Pulse 80   Temp 99 F (37.2 C) (Oral)   Resp 18   Ht 6' (1.829 m)   Wt 81.6 kg   SpO2 95%   BMI 24.41 kg/m   Physical Exam  Constitutional: He appears well-developed and well-nourished. No distress.  HENT:  Head: Normocephalic and atraumatic.  Eyes: Conjunctivae are normal. Right eye exhibits no discharge. Left eye exhibits no discharge.  Neck: Neck supple.  Cardiovascular: Normal rate, regular rhythm and normal heart sounds. Exam reveals no gallop and no friction rub.  No murmur heard. Pulmonary/Chest: Effort normal and breath sounds normal. No respiratory distress.  Abdominal: Soft. He exhibits no distension. There is no tenderness.  Musculoskeletal: He exhibits edema and tenderness.  L elbow swollen. Severe pain with attempted passive or active ROM. No redness or other skin lesions. NVI distally.   Neurological: He is alert.  Skin: Skin is warm  and dry.  Psychiatric: He has a normal mood and affect. His behavior is normal. Thought content normal.  Nursing note and vitals reviewed.    ED Treatments / Results  Labs (all labs ordered are listed, but only abnormal results are displayed) Labs Reviewed - No data to display  EKG None  Radiology No results found.   Dg Elbow Complete Left  Result Date: 09/27/2017 CLINICAL DATA:  Pain EXAM: LEFT ELBOW - COMPLETE 3+ VIEW COMPARISON:  None. FINDINGS: Frontal, lateral, and bilateral oblique views were obtained. No fracture or dislocation. No joint effusion. No appreciable joint space narrowing or erosion. There is a  spur arising from the olecranon process of the proximal ulna. IMPRESSION: Spur arising from the olecranon process of the proximal ulna. No appreciable joint space narrowing or erosion. No fracture or dislocation. Electronically Signed   By: Bretta Bang III M.D.   On: 09/27/2017 11:44    Procedures Procedures (including critical care time)  Medications Ordered in ED Medications - No data to display   Initial Impression / Assessment and Plan / ED Course  I have reviewed the triage vital signs and the nursing notes.  Pertinent labs & imaging results that were available during my care of the patient were reviewed by me and considered in my medical decision making (see chart for details).     46yM with L elbow pain. Likely gout. Offered arthrocentesis for therapeutic and, as a lesser concern, diagnostic purposes. He declined. Symptomatic tx. Return precautions discussed.   Final Clinical Impressions(s) / ED Diagnoses   Final diagnoses:  Acute gout of left elbow, unspecified cause    ED Discharge Orders         Ordered    colchicine 0.6 MG tablet  2 times daily PRN     09/27/17 1244    ibuprofen (ADVIL,MOTRIN) 400 MG tablet  Every 6 hours PRN     09/27/17 1244    predniSONE (DELTASONE) 20 MG tablet  Daily     09/27/17 1244    oxyCODONE-acetaminophen (PERCOCET/ROXICET) 5-325 MG tablet  Every 4 hours PRN     09/27/17 1244           Raeford Razor, MD 09/30/17 1946

## 2021-05-14 ENCOUNTER — Emergency Department (HOSPITAL_BASED_OUTPATIENT_CLINIC_OR_DEPARTMENT_OTHER)
Admission: EM | Admit: 2021-05-14 | Discharge: 2021-05-14 | Disposition: A | Discharge status: Home / Self Care | Payer: Medicaid Other | Attending: Emergency Medicine | Admitting: Emergency Medicine

## 2021-05-14 ENCOUNTER — Encounter (HOSPITAL_BASED_OUTPATIENT_CLINIC_OR_DEPARTMENT_OTHER): Payer: Self-pay | Admitting: Obstetrics and Gynecology

## 2021-05-14 ENCOUNTER — Other Ambulatory Visit (HOSPITAL_BASED_OUTPATIENT_CLINIC_OR_DEPARTMENT_OTHER): Payer: Self-pay

## 2021-05-14 ENCOUNTER — Other Ambulatory Visit: Payer: Self-pay

## 2021-05-14 ENCOUNTER — Emergency Department (HOSPITAL_BASED_OUTPATIENT_CLINIC_OR_DEPARTMENT_OTHER): Payer: Medicaid Other | Admitting: Radiology

## 2021-05-14 DIAGNOSIS — M79644 Pain in right finger(s): Secondary | ICD-10-CM | POA: Diagnosis present

## 2021-05-14 DIAGNOSIS — M79641 Pain in right hand: Secondary | ICD-10-CM

## 2021-05-14 MED ORDER — PREDNISONE 10 MG PO TABS
60.0000 mg | ORAL_TABLET | Freq: Once | ORAL | Status: AC
  Administered 2021-05-14: 60 mg via ORAL
  Filled 2021-05-14: qty 1

## 2021-05-14 MED ORDER — PREDNISONE 20 MG PO TABS
40.0000 mg | ORAL_TABLET | Freq: Every day | ORAL | 0 refills | Status: AC
  Filled 2021-05-14: qty 10, 5d supply, fill #0

## 2021-05-14 NOTE — Discharge Instructions (Addendum)
X-ray of your hand did not show any fracture.  Overall suspect that this is an inflammatory process likely gout.  Take steroids as prescribed.  Recommend 1000 mg of Tylenol every 6 hours as needed for pain as well.  You have been given your first dose of steroid today.  Recommend taking your next dose of steroid tomorrow.  Follow-up with your primary care doctor. ?

## 2021-05-14 NOTE — ED Provider Notes (Signed)
?[facility] EMERGENCY DEPT ?Provider Note ? ? ?CSN: [id number] ?Arrival date & time: 05/14/21  0813 ? ?  ? ?History ? ?Chief Complaint  ?Patient presents with  ? Hand Pain  ? ? ?Edward Navarro is a 51 y.o. male. ? ?Patient here with pain in his right index finger.History of gout in his hands.  Took 1 dose of colchicine did not get much better.  Edward Navarro is concerned that Edward Navarro might of broke his finger sleeping on it.  Denies any numbness or weakness.  Denies any fevers or chills. ? ?The history is provided by the patient.  ? ?  ? ?Home Medications ?Prior to Admission medications   ?Medication Sig Start Date End Date Taking? Authorizing Provider  ?predniSONE (DELTASONE) 20 MG tablet Take 2 tablets (40 mg total) by mouth daily for 5 days. 05/14/21 05/19/21 Yes Naziah Weckerly, DO  ?acetaminophen (TYLENOL) 500 MG tablet Take 1,000 mg by mouth every 6 (six) hours as needed for mild pain, moderate pain or headache.    [provider]  ?allopurinol (ZYLOPRIM) 100 MG tablet Take 100 mg by mouth daily. 12/20/16   [provider]  ?colchicine 0.6 MG tablet Take 1 tablet (0.6 mg total) by mouth 2 (two) times daily. 01/17/17   Arby Barrette, MD  ?colchicine 0.6 MG tablet Take 1 tablet (0.6 mg total) by mouth 2 (two) times daily as needed (gout). 09/27/17   Raeford Razor, MD  ?ibuprofen (ADVIL,MOTRIN) 400 MG tablet Take 1 tablet (400 mg total) by mouth every 6 (six) hours as needed. 09/27/17   Raeford Razor, MD  ?oxyCODONE-acetaminophen (PERCOCET) 5-325 MG tablet Take 1-2 tablets by mouth every 4 (four) hours as needed. ?Patient not taking: Reported on 09/02/2017 01/17/17   Arby Barrette, MD  ?oxyCODONE-acetaminophen (PERCOCET/ROXICET) 5-325 MG tablet Take 1 tablet by mouth every 4 (four) hours as needed for severe pain. 09/27/17   Raeford Razor, MD  ?   ? ?Allergies    ?Ibuprofen and Penicillins   ? ?Review of Systems   ?Review of Systems ? ?Physical Exam ?Updated Vital Signs ?BP (!) 149/80 (BP Location:  Right Arm)   Pulse 60   Temp (!) 97.3 ?F (36.3 ?C) (Oral)   Resp 16   Ht 6' (1.829 m)   Wt 83.9 kg   SpO2 96%   BMI 25.09 kg/m?  ?Physical Exam ?Constitutional:   ?   General: Edward Navarro is not in acute distress. ?Cardiovascular:  ?   Pulses: Normal pulses.  ?Musculoskeletal:     ?   General: Tenderness present. No swelling.  ?   Comments: Tenderness to the right index finger MCP but there is no warmth erythema or deformity, range of motion appears to be intact but limited secondary to pain  ?Neurological:  ?   General: No focal deficit present.  ?   Mental Status: Edward Navarro is alert.  ?   Sensory: No sensory deficit.  ?   Motor: No weakness.  ? ? ?ED Results / Procedures / Treatments   ?Labs ?(all labs ordered are listed, but only abnormal results are displayed) ?Labs Reviewed - No data to display ? ?EKG ?None ? ?Radiology ?No results found. ? ?Procedures ?Procedures  ? ? ?Medications Ordered in ED ?Medications  ?predniSONE (DELTASONE) tablet 60 mg (has no administration in time range)  ? ? ?ED Course/ Medical Decision Making/ A&P ?  ?                        ?  Medical Decision Making ?Amount and/or Complexity of Data Reviewed ?Radiology: ordered. ? ?Risk ?Prescription drug management. ? ? ?Edward Navarro is here with right index finger pain.  Normal vitals.  No fever.  Pain mostly at the right index finger MCP.  This is where Edward Navarro has had gout before.  There is no warmth or erythema.  Looks like may be some chronic tophi/arthritis of the joint but overall suspect that this is gout related or inflammatory related.  Edward Navarro does not have any signs to suggest infection on exam.  I am not concerned about a septic joint or flexor tenosynovitis.  Edward Navarro is concerned that Edward Navarro might of broke his finger sleeping on it funny because Edward Navarro took a dose of colchicine without much relief.  X-ray was obtained and shows no acute fracture per my review and interpretation. 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  Will  prescribe prednisone for likely inflammatory process.  Discharged in good condition. ? ?This chart was dictated using voice recognition software.  Despite best efforts to proofread,  errors can occur which can change the documentation meaning.  ? ? ? ? ? ? ? ?Final Clinical Impression(s) / ED Diagnoses ?Final diagnoses:  ?Pain of right hand  ? ? ?Rx / DC Orders ?ED Discharge Orders   ? ?      Ordered  ?  predniSONE (DELTASONE) 20 MG tablet  Daily       ? 05/14/21 0820  ? ?  ?  ? ?  ? ? ?  ?Virgina Norfolk, DO ?05/14/21 4917 ? ?

## 2021-05-14 NOTE — ED Triage Notes (Signed)
Patient reports to the ER for right hand pain and swelling. Patient reports he has a hx of gout and took his colchicine without relief. Patient reports he thinks he may have hurt it sleeping on it or turning a difficult sink faucet. ?
